# Patient Record
Sex: Female | Born: 1990 | Hispanic: Yes | Marital: Married | Smoking: Never smoker
Health system: Southern US, Community
[De-identification: ages and names within clinical notes are randomized; demographics above are authoritative.]

## PROBLEM LIST (undated history)

## (undated) ENCOUNTER — Inpatient Hospital Stay (HOSPITAL_COMMUNITY): Payer: Self-pay

## (undated) DIAGNOSIS — Z789 Other specified health status: Secondary | ICD-10-CM

## (undated) HISTORY — PX: NO PAST SURGERIES: SHX2092

---

## 2013-10-06 ENCOUNTER — Ambulatory Visit (INDEPENDENT_AMBULATORY_CARE_PROVIDER_SITE_OTHER): Payer: Self-pay | Admitting: Nurse Practitioner

## 2013-10-06 VITALS — BP 98/62 | HR 83 | Temp 98.2°F | Ht 61.0 in | Wt 119.5 lb

## 2013-10-06 DIAGNOSIS — Z309 Encounter for contraceptive management, unspecified: Secondary | ICD-10-CM

## 2013-10-06 MED ORDER — NORGESTIMATE-ETH ESTRADIOL 0.25-35 MG-MCG PO TABS: 1.0000 | ORAL_TABLET | Freq: Every day | ORAL | Status: DC

## 2013-10-06 NOTE — Patient Instructions (Signed)
Oral Contraception Information  Oral contraceptive pills (OCPs) are medicines taken to prevent pregnancy. OCPs work by preventing the ovaries from releasing eggs. The hormones in OCPs also cause the cervical mucus to thicken, preventing the sperm from entering the uterus. The hormones also cause the uterine lining to become thin, not allowing a fertilized egg to attach to the inside of the uterus. OCPs are highly effective when taken exactly as prescribed. However, OCPs do not prevent sexually transmitted diseases (STDs). Safe sex practices, such as using condoms along with the pill, can help prevent STDs.   Before taking the pill, you may have a physical exam and Pap test. Your health care provider may order blood tests. The health care provider will make sure you are a good candidate for oral contraception. Discuss with your health care provider the possible side effects of the OCP you may be prescribed. When starting an OCP, it can take 2 to 3 months for the body to adjust to the changes in hormone levels in your body.   TYPES OF ORAL CONTRACEPTION  · The combination pill This pill contains estrogen and progestin (synthetic progesterone) hormones. The combination pill comes in 21-day, 28-day, or 91-day packs. Some types of combination pills are meant to be taken continuously (365-day pills). With 21-day packs, you do not take pills for 7 days after the last pill. With 28-day packs, the pill is taken every day. The last 7 pills are without hormones. Certain types of pills have more than 21 hormone-containing pills. With 91-day packs, the first 84 pills contain both hormones, and the last 7 pills contain no hormones or contain estrogen only.  · The minipill This pill contains the progesterone hormone only. The pill is taken every day continuously. It is very important to take the pill at the same time each day. The minipill comes in packs of 28 pills. All 28 pills contain the hormone.    ADVANTAGES OF ORAL  CONTRACEPTIVE PILLS  · Decreases premenstrual symptoms.    · Treats menstrual period cramps.    · Regulates the menstrual cycle.    · Decreases a heavy menstrual flow.    · May treat acne, depending on the type of pill.    · Treats abnormal uterine bleeding.    · Treats polycystic ovarian syndrome.    · Treats endometriosis.    · Can be used as emergency contraception.    THINGS THAT CAN MAKE ORAL CONTRACEPTIVE PILLS LESS EFFECTIVE  OCPs can be less effective if:   · You forget to take the pill at the same time every day.    · You have a stomach or intestinal disease that lessens the absorption of the pill.    · You take OCPs with other medicines that make OCPs less effective, such as antibiotics, certain HIV medicines, and some seizure medicines.    · You take expired OCPs.    · You forget to restart the pill on day 7, when using the packs of 21 pills.    RISKS ASSOCIATED WITH ORAL CONTRACEPTIVE PILLS   Oral contraceptive pills can sometimes cause side effects, such as:  · Headache.  · Nausea.  · Breast tenderness.  · Irregular bleeding or spotting.  Combination pills are also associated with a small increased risk of:  · Blood clots.  · Heart attack.  · Stroke.  Document Released: 08/16/2002 Document Revised: 03/16/2013 Document Reviewed: 11/14/2012  [employer]® Patient Information ©2014 [employer].

## 2013-10-06 NOTE — Progress Notes (Signed)
History:  Emily Herman is a 23 y.o. No obstetric history on file. who presents to clinic today to start birth control pills before marriage. She is not and never has been sexually active. She is getting married June 5th and wants to be on them before hand to avoid bleeding on honeymoon and pregnancy. She denies any contraindications to birth control pills.  The following portions of the patient's history were reviewed and updated as appropriate: allergies, current medications, past family history, past medical history, past social history, past surgical history and problem list.  Review of Systems:  Pertinent items are noted in HPI.  Objective:  Physical Exam BP 98/62  Pulse 83  Temp(Src) 98.2 F (36.8 C)  Ht 5\' 1"  (1.549 m)  Wt 119 lb 8 oz (54.205 kg)  BMI 22.59 kg/m2  LMP 09/11/2013 GENERAL: Well-developed, well-nourished female in no acute distress.  HEENT: Normocephalic, atraumatic.  EXTREMITIES: No cyanosis, clubbing, or edema, 2+ distal pulses.   Labs and Imaging No results found. No results found for this or any previous visit (from the past 24 hour(s)).  Assessment & Plan:  Assessment:  Contraception Management  Plans:  OrthoCyclen BCPs advised on use and how to avoid menses RTC 3 months for recheck and can have pelvic exam done then Also [facility]  Emmanuella Mirante M Landa Mullinax, NP 10/06/2013 5:11 PM

## 2013-10-06 NOTE — Progress Notes (Signed)
Pt is not currently sexually active and is getting married 6/5.  She desires to not have her period around this time and is wanting to start birth control pills.

## 2014-03-02 ENCOUNTER — Ambulatory Visit: Payer: Self-pay | Attending: Family Medicine

## 2014-04-13 LAB — OB RESULTS CONSOLE GC/CHLAMYDIA
Chlamydia: NEGATIVE
Gonorrhea: NEGATIVE

## 2014-04-13 LAB — OB RESULTS CONSOLE ABO/RH: RH Type: POSITIVE

## 2014-04-13 LAB — OB RESULTS CONSOLE HIV ANTIBODY (ROUTINE TESTING): HIV: NONREACTIVE

## 2014-04-13 LAB — OB RESULTS CONSOLE HEPATITIS B SURFACE ANTIGEN: Hepatitis B Surface Ag: NEGATIVE

## 2014-04-13 LAB — OB RESULTS CONSOLE RPR: RPR: NONREACTIVE

## 2014-04-13 LAB — OB RESULTS CONSOLE RUBELLA ANTIBODY, IGM: Rubella: IMMUNE

## 2014-04-13 LAB — PROCEDURE REPORT - SCANNED: PAP SMEAR: NEGATIVE

## 2014-04-13 LAB — OB RESULTS CONSOLE ANTIBODY SCREEN: Antibody Screen: NEGATIVE

## 2014-05-19 ENCOUNTER — Other Ambulatory Visit (HOSPITAL_COMMUNITY): Payer: Self-pay | Admitting: Obstetrics

## 2014-05-19 DIAGNOSIS — O4403 Placenta previa specified as without hemorrhage, third trimester: Secondary | ICD-10-CM

## 2014-05-19 DIAGNOSIS — Z3689 Encounter for other specified antenatal screening: Secondary | ICD-10-CM

## 2014-05-19 DIAGNOSIS — Z3A30 30 weeks gestation of pregnancy: Secondary | ICD-10-CM

## 2014-06-09 NOTE — L&D Delivery Note (Signed)
Delivery Note At 2:21 PM a viable female was delivered via Vaginal, Spontaneous Delivery (Presentation: ;  ).  APGAR: , ; weight  .   Placenta status: , .  Cord:  with the following complications: .  Cord pH: not done  Anesthesia: None  Episiotomy:   Lacerations:   Suture Repair: 2.0 vicryl Est. Blood Loss (mL):    Mom to postpartum.  Baby to Couplet care / Skin to Skin.  Jylian Pappalardo A 09/25/2014, 2:33 PM

## 2014-07-07 ENCOUNTER — Other Ambulatory Visit (HOSPITAL_COMMUNITY): Payer: Self-pay

## 2014-07-07 ENCOUNTER — Other Ambulatory Visit (HOSPITAL_COMMUNITY): Payer: Self-pay | Admitting: Obstetrics

## 2014-07-07 ENCOUNTER — Encounter (HOSPITAL_COMMUNITY): Payer: Self-pay

## 2014-07-07 ENCOUNTER — Ambulatory Visit (HOSPITAL_COMMUNITY)
Admission: RE | Admit: 2014-07-07 | Discharge: 2014-07-07 | Disposition: A | Discharge status: Home / Self Care | Payer: Self-pay | Source: Ambulatory Visit | Attending: Obstetrics | Admitting: Obstetrics

## 2014-07-07 DIAGNOSIS — O4403 Placenta previa specified as without hemorrhage, third trimester: Secondary | ICD-10-CM | POA: Insufficient documentation

## 2014-07-07 DIAGNOSIS — O4413 Placenta previa with hemorrhage, third trimester: Secondary | ICD-10-CM | POA: Insufficient documentation

## 2014-07-07 DIAGNOSIS — Z3A26 26 weeks gestation of pregnancy: Secondary | ICD-10-CM | POA: Insufficient documentation

## 2014-07-07 DIAGNOSIS — Z3A30 30 weeks gestation of pregnancy: Secondary | ICD-10-CM

## 2014-07-07 DIAGNOSIS — Z36 Encounter for antenatal screening of mother: Secondary | ICD-10-CM | POA: Insufficient documentation

## 2014-07-07 DIAGNOSIS — Z3689 Encounter for other specified antenatal screening: Secondary | ICD-10-CM

## 2014-07-07 DIAGNOSIS — Z3A29 29 weeks gestation of pregnancy: Secondary | ICD-10-CM | POA: Insufficient documentation

## 2014-07-07 HISTORY — DX: Other specified health status: Z78.9

## 2014-08-04 ENCOUNTER — Ambulatory Visit (HOSPITAL_COMMUNITY): Payer: Self-pay

## 2014-08-17 LAB — OB RESULTS CONSOLE GBS: STREP GROUP B AG: NEGATIVE

## 2014-08-29 ENCOUNTER — Ambulatory Visit (HOSPITAL_COMMUNITY): Payer: Self-pay

## 2014-09-24 ENCOUNTER — Encounter (HOSPITAL_COMMUNITY): Payer: Self-pay | Admitting: *Deleted

## 2014-09-24 ENCOUNTER — Inpatient Hospital Stay (HOSPITAL_COMMUNITY)
Admission: AD | Admit: 2014-09-24 | Discharge: 2014-09-24 | Disposition: A | Discharge status: Home / Self Care | Payer: Self-pay | Source: Ambulatory Visit | Attending: Obstetrics | Admitting: Obstetrics

## 2014-09-24 DIAGNOSIS — O4693 Antepartum hemorrhage, unspecified, third trimester: Secondary | ICD-10-CM

## 2014-09-24 DIAGNOSIS — O26853 Spotting complicating pregnancy, third trimester: Secondary | ICD-10-CM | POA: Insufficient documentation

## 2014-09-24 DIAGNOSIS — Z3A40 40 weeks gestation of pregnancy: Secondary | ICD-10-CM | POA: Insufficient documentation

## 2014-09-24 NOTE — MAU Note (Signed)
Pt woke up to go the bathroom and saw some blood in the toilet.  Has not seen any blood since then.  States having some mild cramping.  Denies LOF.  + Fetal movement.

## 2014-09-24 NOTE — Discharge Instructions (Signed)
Evaluacin de los movimientos fetales  (Fetal Movement Counts) Nombre del paciente: __________________________________________________ Emily ChapmanFecha de parto estimada: ____________________ Emily HammanLa evaluacin de los movimientos fetales es muy recomendable en los embarazos de alto riesgo, pero tambin es una buena idea que lo hagan todas las Dominoembarazadas. El Firefightermdico le indicar que comience a contarlos a las 28 semanas de Metuchenembarazo. Los movimientos fetales suelen aumentar:   Despus de Animatoruna comida completa.  Despus de la actividad fsica.  Despus de comer o beber Emily Herman.  En reposo. Preste atencin cuando sienta que el beb est ms activo. Esto le ayudar a notar un patrn de ciclos de vigilia y sueo de su beb y cules son los factores que contribuyen a un aumento de los movimientos fetales. Es importante llevar a cabo un recuento de movimientos fetales, al mismo tiempo cada da, cuando el beb normalmente est ms activo.  CMO CONTAR LOS MOVIMIENTOS FETALES  Busque un lugar tranquilo y cmodo para sentarse o recostarse sobre el lado izquierdo. Al recostarse sobre su lado izquierdo, le proporciona una mejor circulacin de Emily Herman y oxgeno al beb.  Anote el da y la hora en una hoja de papel o en un diario.  Comience contando las pataditas, revoloteos, chasquidos, vueltas o pinchazos en un perodo de 2 horas. Debe sentir al menos 10 movimientos en 2 horas.  Si no siente 10 movimientos en 2 horas, espere 2  3 horas y cuente de nuevo. Busque cambios en el patrn o si no cuenta lo suficiente en 2 horas. SOLICITE ATENCIN MDICA SI:   Siente menos de 10 pataditas en 2 horas, en dos intentos.  No hay movimientos durante una hora.  El patrn se modifica o le lleva ms tiempo Art gallery managercada da contar las 10 pataditas.  Siente que el beb no se mueve como lo hace habitualmente. Fecha: ____________ Movimientos: ____________ Emily BornHora de inicio: ____________ Emily BornHora de finalizacin: ____________  Emily NonesFecha:  ____________ Movimientos: ____________ Emily BornHora de inicio: ____________ Emily BornHora de finalizacin: ____________  Emily NonesFecha: ____________ Movimientos: ____________ Emily BornHora de inicio: ____________ Emily BornHora de finalizacin: ____________  Emily NonesFecha: ____________ Movimientos: ____________ Emily BornHora de inicio: ____________ Emily BornHora de finalizacin: ____________  Emily NonesFecha: ____________ Movimientos: ____________ Emily BornHora de inicio: ____________ Emily RussianHora de finalizacin: ____________  Emily NonesFecha: ____________ Movimientos: ____________ Emily RussianHora de inicio: ____________ Emily RussianHora de finalizacin: ____________  Emily NonesFecha: ____________ Movimientos: ____________ Emily RussianHora de inicio: ____________ Emily RussianHora de finalizacin: ____________  Emily NonesFecha: ____________ Movimientos: ____________ Emily RussianHora de inicio: ____________ Emily RussianHora de finalizacin: ____________  Emily NonesFecha: ____________ Movimientos: ____________ Emily RussianHora de inicio: ____________ Emily RussianHora de finalizacin: ____________  Emily NonesFecha: ____________ Movimientos: ____________ Emily RussianHora de inicio: ____________ Emily RussianHora de finalizacin: ____________  Emily NonesFecha: ____________ Movimientos: ____________ Emily RussianHora de inicio: ____________ Emily RussianHora de finalizacin: ____________  Emily NonesFecha: ____________ Movimientos: ____________ Emily RussianHora de inicio: ____________ Emily RussianHora de finalizacin: ____________  Emily NonesFecha: ____________ Movimientos: ____________ Emily RussianHora de inicio: ____________ Emily RussianHora de finalizacin: ____________  Emily NonesFecha: ____________ Movimientos: ____________ Emily RussianHora de inicio: ____________ Emily RussianHora de finalizacin: ____________  Emily NonesFecha: ____________ Movimientos: ____________ Emily RussianHora de inicio: ____________ Emily RussianHora de finalizacin: ____________  Emily NonesFecha: ____________ Movimientos: ____________ Emily RussianHora de inicio: ____________ Emily RussianHora de finalizacin: ____________  Emily NonesFecha: ____________ Movimientos: ____________ Emily RussianHora de inicio: ____________ Emily RussianHora de finalizacin: ____________  Emily NonesFecha: ____________ Movimientos: ____________ Emily RussianHora de inicio: ____________ Emily RussianHora de finalizacin: ____________  Emily NonesFecha: ____________ Movimientos: ____________ Emily RussianHora  de inicio: ____________ Emily RussianHora de finalizacin: ____________  Emily NonesFecha: ____________ Movimientos: ____________ Emily RussianHora de inicio: ____________ Emily BornHora de finalizacin: ____________  Emily NonesFecha: ____________ Movimientos: ____________ Emily BornHora de inicio: ____________ Emily BornHora de finalizacin: ____________  Emily NonesFecha: ____________ Movimientos: ____________ Emily BornHora de inicio: ____________ Emily BornHora de  finalizacin: ____________  Emily Herman: ____________ Movimientos: ____________ Emily Herman inicio: ____________ Emily Herman finalizacin: ____________  Emily Herman: ____________ Movimientos: ____________ Emily Herman inicio: ____________ Emily Herman finalizacin: ____________  Emily Herman: ____________ Movimientos: ____________ Emily Herman inicio: ____________ Emily Herman finalizacin: ____________  Emily Herman: ____________ Movimientos: ____________ Emily Herman inicio: ____________ Emily Herman finalizacin: ____________  Emily Herman: ____________ Movimientos: ____________ Emily Herman inicio: ____________ Emily Herman finalizacin: ____________  Emily Herman: ____________ Movimientos: ____________ Emily Herman inicio: ____________ Emily Herman de finalizacin: ____________  Emily Herman: ____________ Movimientos: ____________ Emily Herman de inicio: ____________ Emily Herman de finalizacin: ____________  Emily Herman: ____________ Movimientos: ____________ Emily Herman de inicio: ____________ Emily Herman de finalizacin: ____________  Emily Herman: ____________ Movimientos: ____________ Emily Herman de inicio: ____________ Emily Herman de finalizacin: ____________  Emily Herman: ____________ Movimientos: ____________ Emily Herman de inicio: ____________ Emily Herman de finalizacin: ____________  Emily Herman: ____________ Movimientos: ____________ Emily Herman de inicio: ____________ Emily Herman de finalizacin: ____________  Emily Herman: ____________ Movimientos: ____________ Emily Herman de inicio: ____________ Emily Herman de finalizacin: ____________  Emily Herman: ____________ Movimientos: ____________ Emily Herman de inicio: ____________ Emily Herman de finalizacin: ____________  Emily Herman: ____________ Movimientos: ____________ Emily Herman de inicio: ____________ Emily Herman de finalizacin:  ____________  Emily Herman: ____________ Movimientos: ____________ Emily Herman de inicio: ____________ Emily Herman de finalizacin: ____________  Emily Herman: ____________ Movimientos: ____________ Emily Herman de inicio: ____________ Emily Herman de finalizacin: ____________  Emily Herman: ____________ Movimientos: ____________ Emily Herman de inicio: ____________ Emily Herman de finalizacin: ____________  Emily Herman: ____________ Movimientos: ____________ Emily Herman de inicio: ____________ Emily Herman de finalizacin: ____________  Emily Herman: ____________ Movimientos: ____________ Emily Herman de inicio: ____________ Emily Herman de finalizacin: ____________  Emily Herman: ____________ Movimientos: ____________ Emily Herman de inicio: ____________ Emily Herman de finalizacin: ____________  Emily Herman: ____________ Movimientos: ____________ Emily Herman de inicio: ____________ Emily Herman de finalizacin: ____________  Emily Herman: ____________ Movimientos: ____________ Emily Herman de inicio: ____________ Emily Herman de finalizacin: ____________  Emily Herman: ____________ Movimientos: ____________ Emily Herman de inicio: ____________ Emily Herman de finalizacin: ____________  Emily Herman: ____________ Movimientos: ____________ Emily Herman de inicio: ____________ Emily Herman de finalizacin: ____________  Emily Herman: ____________ Movimientos: ____________ Emily Herman de inicio: ____________ Emily Herman de finalizacin: ____________  Emily Herman: ____________ Movimientos: ____________ Emily Herman de inicio: ____________ Emily Herman de finalizacin: ____________  Emily Herman: ____________ Movimientos: ____________ Emily Herman de inicio: ____________ Emily Herman de finalizacin: ____________  Emily Herman: ____________ Movimientos: ____________ Emily Herman de inicio: ____________ Emily Herman de finalizacin: ____________  Emily Herman: ____________ Movimientos: ____________ Emily Herman de inicio: ____________ Emily Herman de finalizacin: ____________  Emily Herman: ____________ Movimientos: ____________ Emily Herman de inicio: ____________ Emily Herman de finalizacin: ____________  Emily Herman: ____________ Movimientos: ____________ Emily Herman de inicio: ____________ Emily Herman de finalizacin: ____________  Emily Herman: ____________  Movimientos: ____________ Emily Herman de inicio: ____________ Emily Herman de finalizacin: ____________  Emily Herman: ____________ Movimientos: ____________ Emily Herman de inicio: ____________ Emily Herman de finalizacin: ____________  Emily Herman: ____________ Movimientos: ____________ Emily Herman de inicio: ____________ Emily Herman de finalizacin: ____________  Document Released: 09/02/2007 Document Revised: 05/12/2012 ExitCare Patient Information 2015 [place], [place]. This information is not intended to replace advice given to you by your health care provider. Make sure you discuss any questions you have with your health care provider. Fetal Movement Counts Patient Name: __________________________________________________ Patient Due Date: ____________________ Performing a fetal movement count is highly recommended in high-risk pregnancies, but it is good for every pregnant woman to do. Your health care provider may ask you to start counting fetal movements at 28 weeks of the pregnancy. Fetal movements often increase:  After eating a full meal.  After physical activity.  After eating or drinking something sweet or cold.  At rest. Pay attention to when you feel the baby is most active. This will help you notice a pattern of your baby's sleep and wake cycles and what factors contribute to an increase in fetal movement. It is  important to perform a fetal movement count at the same time each day when your baby is normally most active.  HOW TO COUNT FETAL MOVEMENTS  Find a quiet and comfortable area to sit or lie down on your left side. Lying on your left side provides the best blood and oxygen circulation to your baby.  Write down the day and time on a sheet of paper or in a journal.  Start counting kicks, flutters, swishes, rolls, or jabs in a 2-hour period. You should feel at least 10 movements within 2 hours.  If you do not feel 10 movements in 2 hours, wait 2-3 hours and count again. Look for a change in the pattern or not enough counts in 2  hours. SEEK MEDICAL CARE IF:  You feel less than 10 counts in 2 hours, tried twice.  There is no movement in over an hour.  The pattern is changing or taking longer each day to reach 10 counts in 2 hours.  You feel the baby is not moving as he or she usually does. Date: ____________ Movements: ____________ Start time: ____________ Doreatha Martin time: ____________  Date: ____________ Movements: ____________ Start time: ____________ Doreatha Martin time: ____________ Date: ____________ Movements: ____________ Start time: ____________ Doreatha Martin time: ____________ Date: ____________ Movements: ____________ Start time: ____________ Doreatha Martin time: ____________ Date: ____________ Movements: ____________ Start time: ____________ Doreatha Martin time: ____________ Date: ____________ Movements: ____________ Start time: ____________ Doreatha Martin time: ____________ Date: ____________ Movements: ____________ Start time: ____________ Doreatha Martin time: ____________ Date: ____________ Movements: ____________ Start time: ____________ Doreatha Martin time: ____________  Date: ____________ Movements: ____________ Start time: ____________ Doreatha Martin time: ____________ Date: ____________ Movements: ____________ Start time: ____________ Doreatha Martin time: ____________ Date: ____________ Movements: ____________ Start time: ____________ Doreatha Martin time: ____________ Date: ____________ Movements: ____________ Start time: ____________ Doreatha Martin time: ____________ Date: ____________ Movements: ____________ Start time: ____________ Doreatha Martin time: ____________ Date: ____________ Movements: ____________ Start time: ____________ Doreatha Martin time: ____________ Date: ____________ Movements: ____________ Start time: ____________ Doreatha Martin time: ____________  Date: ____________ Movements: ____________ Start time: ____________ Doreatha Martin time: ____________ Date: ____________ Movements: ____________ Start time: ____________ Doreatha Martin time: ____________ Date: ____________ Movements: ____________ Start time:  ____________ Doreatha Martin time: ____________ Date: ____________ Movements: ____________ Start time: ____________ Doreatha Martin time: ____________ Date: ____________ Movements: ____________ Start time: ____________ Doreatha Martin time: ____________ Date: ____________ Movements: ____________ Start time: ____________ Doreatha Martin time: ____________ Date: ____________ Movements: ____________ Start time: ____________ Doreatha Martin time: ____________  Date: ____________ Movements: ____________ Start time: ____________ Doreatha Martin time: ____________ Date: ____________ Movements: ____________ Start time: ____________ Doreatha Martin time: ____________ Date: ____________ Movements: ____________ Start time: ____________ Doreatha Martin time: ____________ Date: ____________ Movements: ____________ Start time: ____________ Doreatha Martin time: ____________ Date: ____________ Movements: ____________ Start time: ____________ Doreatha Martin time: ____________ Date: ____________ Movements: ____________ Start time: ____________ Doreatha Martin time: ____________ Date: ____________ Movements: ____________ Start time: ____________ Doreatha Martin time: ____________  Date: ____________ Movements: ____________ Start time: ____________ Doreatha Martin time: ____________ Date: ____________ Movements: ____________ Start time: ____________ Doreatha Martin time: ____________ Date: ____________ Movements: ____________ Start time: ____________ Doreatha Martin time: ____________ Date: ____________ Movements: ____________ Start time: ____________ Doreatha Martin time: ____________ Date: ____________ Movements: ____________ Start time: ____________ Doreatha Martin time: ____________ Date: ____________ Movements: ____________ Start time: ____________ Doreatha Martin time: ____________ Date: ____________ Movements: ____________ Start time: ____________ Doreatha Martin time: ____________  Date: ____________ Movements: ____________ Start time: ____________ Doreatha Martin time: ____________ Date: ____________ Movements: ____________ Start time: ____________ Doreatha Martin time: ____________ Date:  ____________ Movements: ____________ Start time: ____________ Doreatha Martin time: ____________ Date: ____________ Movements: ____________ Start time: ____________ Doreatha Martin time: ____________ Date: ____________ Movements: ____________ Start time: ____________ Doreatha Martin  time: ____________ Date: ____________ Movements: ____________ Start time: ____________ Doreatha Martin time: ____________ Date: ____________ Movements: ____________ Start time: ____________ Doreatha Martin time: ____________  Date: ____________ Movements: ____________ Start time: ____________ Doreatha Martin time: ____________ Date: ____________ Movements: ____________ Start time: ____________ Doreatha Martin time: ____________ Date: ____________ Movements: ____________ Start time: ____________ Doreatha Martin time: ____________ Date: ____________ Movements: ____________ Start time: ____________ Doreatha Martin time: ____________ Date: ____________ Movements: ____________ Start time: ____________ Doreatha Martin time: ____________ Date: ____________ Movements: ____________ Start time: ____________ Doreatha Martin time: ____________ Date: ____________ Movements: ____________ Start time: ____________ Doreatha Martin time: ____________  Date: ____________ Movements: ____________ Start time: ____________ Doreatha Martin time: ____________ Date: ____________ Movements: ____________ Start time: ____________ Doreatha Martin time: ____________ Date: ____________ Movements: ____________ Start time: ____________ Doreatha Martin time: ____________ Date: ____________ Movements: ____________ Start time: ____________ Doreatha Martin time: ____________ Date: ____________ Movements: ____________ Start time: ____________ Doreatha Martin time: ____________ Date: ____________ Movements: ____________ Start time: ____________ Doreatha Martin time: ____________ Document Released: 06/25/2006 Document Revised: 10/10/2013 Document Reviewed: 03/22/2012 ExitCare Patient Information 2015 [place]. This information is not intended to replace advice given to you by your health care provider. Make  sure you discuss any questions you have with your health care provider. Contracciones de Designer, multimedia (Braxton Hicks Contractions) Durante el [place], pueden presentarse contracciones uterinas que no siempre indican que est en [place].  QU SON LAS CONTRACCIONES DE BRAXTON HICKS?  Las [employer] se presentan antes del [place] de [place] se conocen como contracciones de [place] o falso trabajo de [place]. Hacia el final del embarazo (32 a 34semanas), estas contracciones pueden aparecen con ms frecuencia y volverse ms intensas. No corresponden al Aleen Campi de parto verdadero porque estas contracciones no producen el agrandamiento (la dilatacin) y el afinamiento del cuello del tero. Algunas veces, es difcil distinguirlas del trabajo de parto verdadero porque en algunos casos pueden ser [employer] y las personas tienen diferentes niveles de tolerancia al [employer] No debe sentirse avergonzada si concurre al hospital con falso trabajo de [place]. En ocasiones, la nica forma de saber si el trabajo de parto es verdadero es que el mdico determine si hay cambios en el cuello del tero. Si no hay problemas prenatales u otras complicaciones de salud asociadas con el embarazo, no habr inconvenientes si la envan a su casa con falso trabajo de parto y espera que comience el verdadero. CMO DIFERENCIAR EL TRABAJO DE PARTO FALSO DEL VERDADERO Falso trabajo de parto  Las contracciones del falso trabajo de parto duran menos y no son tan intensas como las verdaderas.  Generalmente son irregulares.  A menudo, se sienten en la parte delantera de la parte baja del abdomen y en la ingle,  y pueden desaparecer cuando camina o cambia de posicin mientras est acostada.  Las contracciones se vuelven ms dbiles y su duracin es Adult nurse a medida que el tiempo transcurre.  Por lo general, no se hacen progresivamente ms intensas, regulares y Herbalist entre s como en el caso del [place] de parto  verdadero. Theodis Blaze de parto  Las contracciones del verdadero trabajo de parto duran de 30 a 70segundos, son muy regulares y suelen volverse ms intensas, y [place] su frecuencia.  No desaparecen cuando camina.  La molestia generalmente se siente en la parte superior del tero y se extiende hacia la zona inferior del abdomen y [employer] cintura.  El mdico podr examinarla para determinar si el trabajo de parto es verdadero. El examen mostrar si el cuello del tero se est dilatando y [place]. LO QUE DEBE RECORDAR  Contine  haciendo los ejercicios habituales y siga otras indicaciones que el mdico le d.  Tome todos los medicamentos como le indic el mdico.  Oceanographer a las visitas prenatales regulares.  Coma y beba con moderacin si cree que est en trabajo de parto.  Si las contracciones de [employer] provocan incomodidad:  Cambie de posicin: si est acostada o descansando, camine; si est caminando, descanse.  Sintese y descanse en una baera con agua tibia.  Beba 2 o 3vasos de [place]. La deshidratacin puede provocar contracciones.  Respire lenta y profundamente varias veces por hora. CUNDO DEBO BUSCAR ASISTENCIA MDICA INMEDIATA? Solicite atencin mdica de inmediato si:  Las contracciones se intensifican, se hacen ms regulares y Arboriculturist s.  Tiene una prdida de lquido por la vagina.  Tiene fiebre.  Elimina mucosidad manchada con [place].  Tiene una hemorragia vaginal abundante.  Tiene dolor abdominal permanente.  Tiene un dolor en la zona lumbar que nunca tuvo antes.  Siente que la cabeza del beb empuja hacia abajo y ejerce presin en la zona plvica.  El beb no se mueve Dentist. Document Released: 03/05/2005 Document Revised: 05/31/2013 [facility] Patient Information 2015 [place], [place]. This information is not intended to replace advice given to you by your health care provider. Make sure you discuss any questions you  have with your health care provider. Braxton Hicks Contractions Contractions of the uterus can occur throughout pregnancy. Contractions are not always a sign that you are in labor.  WHAT ARE BRAXTON HICKS CONTRACTIONS?  Contractions that occur before labor are called Braxton Hicks contractions, or false labor. Toward the end of pregnancy (32-34 weeks), these contractions can develop more often and may become more forceful. This is not true labor because these contractions do not result in opening (dilatation) and thinning of the cervix. They are sometimes difficult to tell apart from true labor because these contractions can be forceful and people have different pain tolerances. You should not feel embarrassed if you go to the hospital with false labor. Sometimes, the only way to tell if you are in true labor is for your health care provider to look for changes in the cervix. If there are no prenatal problems or other health problems associated with the pregnancy, it is completely safe to be sent home with false labor and await the onset of true labor. HOW CAN YOU TELL THE DIFFERENCE BETWEEN TRUE AND FALSE LABOR? False Labor  The contractions of false labor are usually shorter and not as hard as those of true labor.   The contractions are usually irregular.   The contractions are often felt in the front of the lower abdomen and in the groin.   The contractions may go away when you walk around or change positions while lying down.   The contractions get weaker and are shorter lasting as time goes on.   The contractions do not usually become progressively stronger, regular, and closer together as with true labor.  True Labor  Contractions in true labor last 30-70 seconds, become very regular, usually become more intense, and increase in frequency.   The contractions do not go away with walking.   The discomfort is usually felt in the top of the uterus and spreads to the lower abdomen  and low back.   True labor can be determined by your health care provider with an exam. This will show that the cervix is dilating and getting thinner.  WHAT TO REMEMBER  Keep up with your  usual exercises and follow other instructions given by your health care provider.   Take medicines as directed by your health care provider.   Keep your regular prenatal appointments.   Eat and drink lightly if you think you are going into labor.   If Braxton Hicks contractions are making you uncomfortable:   Change your position from lying down or resting to walking, or from walking to resting.   Sit and rest in a tub of warm water.   Drink 2-3 glasses of water. Dehydration may cause these contractions.   Do slow and deep breathing several times an hour.  WHEN SHOULD I SEEK IMMEDIATE MEDICAL CARE? Seek immediate medical care if:  Your contractions become stronger, more regular, and closer together.   You have fluid leaking or gushing from your vagina.   You have a fever.   You pass blood-tinged mucus.   You have vaginal bleeding.   You have continuous abdominal pain.   You have low back pain that you never had before.   You feel your baby's head pushing down and causing pelvic pressure.   Your baby is not moving as much as it used to.  Document Released: 05/26/2005 Document Revised: 05/31/2013 Document Reviewed: 03/07/2013 [facility] Patient Information 2015 [place], [place]. This information is not intended to replace advice given to you by your health care provider. Make sure you discuss any questions you have with your health care provider. Contracciones de Designer, multimedia (Braxton Hicks Contractions) Durante el [place], pueden presentarse contracciones uterinas que no siempre indican que est en [place].  QU SON LAS CONTRACCIONES DE BRAXTON HICKS?  Las [employer] se presentan antes del [place] de [place] se conocen como contracciones de [place] o falso trabajo de [place]. Hacia el final del embarazo (32 a 34semanas), estas contracciones pueden aparecen con ms frecuencia y volverse ms intensas. No corresponden al Aleen Campi de parto verdadero porque estas contracciones no producen el agrandamiento (la dilatacin) y el afinamiento del cuello del tero. Algunas veces, es difcil distinguirlas del trabajo de parto verdadero porque en algunos casos pueden ser [employer], y las personas tienen diferentes niveles de tolerancia al [employer]. No debe sentirse avergonzada si concurre al hospital con falso trabajo de [place]. En ocasiones, la nica forma de saber si el trabajo de parto es verdadero es que el mdico determine si hay cambios en el cuello del tero. Si no hay problemas prenatales u otras complicaciones de salud asociadas con el embarazo, no habr inconvenientes si la envan a su casa con falso trabajo de parto y espera que comience el verdadero. CMO DIFERENCIAR EL TRABAJO DE PARTO FALSO DEL VERDADERO Falso trabajo de parto  Las contracciones del falso trabajo de parto duran menos y no son tan intensas como las verdaderas.  Generalmente son irregulares.  A menudo, se sienten en la parte delantera de la parte baja del abdomen y en la ingle,  y pueden desaparecer cuando camina o cambia de posicin mientras est acostada.  Las contracciones se vuelven ms dbiles y su duracin es Adult nurse a medida que el tiempo transcurre.  Por lo general, no se hacen progresivamente ms intensas, regulares y Herbalist entre s como en el caso del [place] de parto verdadero. Theodis Blaze de parto  Las contracciones del verdadero trabajo de parto duran de 30 a 70segundos, son muy regulares y suelen volverse ms intensas, y [place] su frecuencia.  No desaparecen cuando camina.  La molestia generalmente se siente en la parte superior  del tero y se extiende hacia la zona inferior del abdomen y [employer] cintura.  El mdico podr examinarla para  determinar si el trabajo de parto es verdadero. El examen mostrar si el cuello del tero se est dilatando y [place]. LO QUE DEBE RECORDAR  Contine haciendo los ejercicios habituales y siga otras indicaciones que el mdico le d.  Tome todos los medicamentos como le indic el mdico.  Oceanographer a las visitas prenatales regulares.  Coma y beba con moderacin si cree que est en trabajo de parto.  Si las contracciones de [employer] provocan incomodidad:  Cambie de posicin: si est acostada o descansando, camine; si est caminando, descanse.  Sintese y descanse en una baera con agua tibia.  Beba 2 o 3vasos de [place]. La deshidratacin puede provocar contracciones.  Respire lenta y profundamente varias veces por hora. CUNDO DEBO BUSCAR ASISTENCIA MDICA INMEDIATA? Solicite atencin mdica de inmediato si:  Las contracciones se intensifican, se hacen ms regulares y Arboriculturist s.  Tiene una prdida de lquido por la vagina.  Tiene fiebre.  Elimina mucosidad manchada con Star.  Tiene una hemorragia vaginal abundante.  Tiene dolor abdominal permanente.  Tiene un dolor en la zona lumbar que nunca tuvo antes.  Siente que la cabeza del beb empuja hacia abajo y ejerce presin en la zona plvica.  El beb no se mueve Dentist. Document Released: 03/05/2005 Document Revised: 05/31/2013 [facility] Patient Information 2015 [place], [place]. This information is not intended to replace advice given to you by your health care provider. Make sure you discuss any questions you have with your health care provider. Braxton Hicks Contractions Contractions of the uterus can occur throughout pregnancy. Contractions are not always a sign that you are in labor.  WHAT ARE BRAXTON HICKS CONTRACTIONS?  Contractions that occur before labor are called Braxton Hicks contractions, or false labor. Toward the end of pregnancy (32-34 weeks), these contractions can develop more often  and may become more forceful. This is not true labor because these contractions do not result in opening (dilatation) and thinning of the cervix. They are sometimes difficult to tell apart from true labor because these contractions can be forceful and people have different pain tolerances. You should not feel embarrassed if you go to the hospital with false labor. Sometimes, the only way to tell if you are in true labor is for your health care provider to look for changes in the cervix. If there are no prenatal problems or other health problems associated with the pregnancy, it is completely safe to be sent home with false labor and await the onset of true labor. HOW CAN YOU TELL THE DIFFERENCE BETWEEN TRUE AND FALSE LABOR? False Labor  The contractions of false labor are usually shorter and not as hard as those of true labor.   The contractions are usually irregular.   The contractions are often felt in the front of the lower abdomen and in the groin.   The contractions may go away when you walk around or change positions while lying down.   The contractions get weaker and are shorter lasting as time goes on.   The contractions do not usually become progressively stronger, regular, and closer together as with true labor.  True Labor  Contractions in true labor last 30-70 seconds, become very regular, usually become more intense, and increase in frequency.   The contractions do not go away with walking.   The discomfort is usually felt in the  top of the uterus and spreads to the lower abdomen and low back.   True labor can be determined by your health care provider with an exam. This will show that the cervix is dilating and getting thinner.  WHAT TO REMEMBER  Keep up with your usual exercises and follow other instructions given by your health care provider.   Take medicines as directed by your health care provider.   Keep your regular prenatal appointments.   Eat and  drink lightly if you think you are going into labor.   If Braxton Hicks contractions are making you uncomfortable:   Change your position from lying down or resting to walking, or from walking to resting.   Sit and rest in a tub of warm water.   Drink 2-3 glasses of water. Dehydration may cause these contractions.   Do slow and deep breathing several times an hour.  WHEN SHOULD I SEEK IMMEDIATE MEDICAL CARE? Seek immediate medical care if:  Your contractions become stronger, more regular, and closer together.   You have fluid leaking or gushing from your vagina.   You have a fever.   You pass blood-tinged mucus.   You have vaginal bleeding.   You have continuous abdominal pain.   You have low back pain that you never had before.   You feel your baby's head pushing down and causing pelvic pressure.   Your baby is not moving as much as it used to.  Document Released: 05/26/2005 Document Revised: 05/31/2013 Document Reviewed: 03/07/2013 [facility] Patient Information 2015 [place], [place]. This information is not intended to replace advice given to you by your health care provider. Make sure you discuss any questions you have with your health care provider.  Hemorragia vaginal durante el embarazo (tercer trimestre) (Vaginal Bleeding During Pregnancy, Third Trimester)  Durante el embarazo, es comn tener una pequea hemorragia vaginal (manchas). A veces, la hemorragia es normal y no representa un problema, pero en algunas ocasiones es un sntoma de algo grave. Asegrese de decirle a su mdico de inmediato si tiene algn tipo de hemorragia vaginal. CUIDADOS EN EL HOGAR  Controle su afeccin para ver si hay cambios.  Siga las indicaciones de su mdico con respecto al [place]grado de actividad que [place] Mountainpuede tener.  Si debe hacer reposo en cama:  Es posible que deba quedarse en cama y levantarse nicamente para ir al bao.  Quizs le permitan hacer The PNC Financialalgunas actividades.  Si  es necesario, planifique que alguien la ayude.  Marcelino FreestoneEscriba:  La cantidad de toallas higinicas que [place]ausa cada da.  La frecuencia con la que se cambia las toallas higinicas.  Indique que tan empapados (saturados) estn.  No use tampones.  No se haga duchas vaginales.  No tenga relaciones sexuales ni orgasmos hasta que el mdico la autorice.  Siga los consejos de su mdico acerca de levantar objetos pesados, conducir automviles y Education officer, environmentalrealizar actividad fsica.  Si elimina tejido por la vagina, gurdelo para mostrrselo al American Expressmdico.  Tome los medicamentos solamente como se lo haya indicado el mdico.  No tome aspirina, ya que puede causar hemorragias.  Concurra a todas las visitas de control como se lo haya indicado el mdico. SOLICITE AYUDA SI:   Tiene una hemorragia vaginal.  Tiene clicos.  Tiene dolores de [place].  Tiene fiebre que no desaparece despus de Teacher, adult educationtomar medicamentos. SOLICITE AYUDA DE INMEDIATO SI:  Siente clicos muy intensos en la espalda o en el vientre (abdomen).  Tiene escalofros.  Tiene una prdida de lquido por  la vagina.  Elimina cogulos grandes o tejido por la vagina.  Tiene ms hemorragia.  Se siente dbil o que va a desvanecerse.  Pierde el conocimiento (se desmaya).  No siente que el beb se mueva tanto como antes. ASEGRESE DE QUE:  Comprende estas instrucciones.  Controlar su afeccin.  Recibir ayuda de inmediato si no mejora o si empeora. Document Released: 10/10/2013 [facility] Patient Information 2015 [place], [place]. This information is not intended to replace advice given to you by your health care provider. Make sure you discuss any questions you have with your health care provider.

## 2014-09-24 NOTE — MAU Provider Note (Signed)
  History     CSN: [id number]  Arrival date and time: 09/24/14 [id number]   First Provider Initiated Contact with Patient 09/24/14 0354      Chief Complaint  Patient presents with  . Vaginal Bleeding   HPI Emily Herman 23 y.o. G1P0 @[redacted]w[redacted]d  presents to MAU with vaginal spotting.  She awakened at 3am to urinate and noticed a small amt of vaginal bleeding in the toilet.  She states it is more like spotting than bleeding.  No further blood noticed after wiping.  Endorses good fetal movement.  Denies LOF, contractions, abdominal pain, dysuria.   OB History    Gravida Para Term Preterm AB TAB SAB Ectopic Multiple Living   1         0      Past Medical History  Diagnosis Date  . Medical history non-contributory     Past Surgical History  Procedure Laterality Date  . No past surgeries      History reviewed. No pertinent family history.  History  Substance Use Topics  . Smoking status: Never Smoker   . Smokeless tobacco: Not on file  . Alcohol Use: No    Allergies: No Known Allergies  Prescriptions prior to admission  Medication Sig Dispense Refill Last Dose  . Prenatal Multivit-Min-Fe-FA (PRENATAL VITAMINS PO) Take by mouth.   09/23/2014 at Unknown time  . norgestimate-ethinyl estradiol (ORTHO-CYCLEN,SPRINTEC,PREVIFEM) 0.25-35 MG-MCG tablet Take 1 tablet by mouth daily. (Patient not taking: Reported on 07/07/2014) 1 Package 11 Not Taking    ROS Pertinent ROS in HPI  Physical Exam   Blood pressure 111/77, pulse 88, temperature 98.6 F (37 C), temperature source Oral, resp. rate 16, height 5\' 1"  (1.549 m), weight 151 lb 1.3 oz (68.529 kg), last menstrual period 12/14/2013.  Physical Exam  Constitutional: She is oriented to person, place, and time. She appears well-developed and well-nourished. No distress.  HENT:  Head: Normocephalic and atraumatic.  Eyes: EOM are normal.  Neck: Normal range of motion.  Cardiovascular: Normal rate and regular rhythm.    Respiratory: Breath sounds normal. No respiratory distress.  GI: Soft. Bowel sounds are normal. She exhibits no distension. There is no tenderness. There is no rebound and no guarding.  Genitourinary:  Small amt of dark red blood seen in vagina but no bleeding noted at the os.   Cervix is finger tip, thick, posterior  Musculoskeletal: Normal range of motion.  Neurological: She is alert and oriented to person, place, and time.  Skin: Skin is warm and dry.  Psychiatric: She has a normal mood and affect.   Fetal Tracing: Baseline: 130 Variability:mod Accelerations: 15x15 Decelerations: Toco: irritability with occasional contraction noted   MAU Course  Procedures  MDM Tracing is reactive  Assessment and Plan  A:  1. Vaginal bleeding in pregnancy, third trimester   Likely bloody show.    P: Discharge to home RN to contact Dr. Clearance CootsHarper for further directions Keep all f/u scheduled Patient may return to MAU as needed or if her condition were to change or worsen   Bertram Denvereague Clark, [name] 09/24/2014, 3:55 AM

## 2014-09-25 ENCOUNTER — Encounter (HOSPITAL_COMMUNITY): Payer: Self-pay

## 2014-09-25 ENCOUNTER — Inpatient Hospital Stay (HOSPITAL_COMMUNITY)
Admission: RE | Admit: 2014-09-25 | Discharge: 2014-09-27 | DRG: 775 | Disposition: A | Discharge status: Home / Self Care | Payer: Medicaid Other | Source: Ambulatory Visit | Attending: Obstetrics | Admitting: Obstetrics

## 2014-09-25 DIAGNOSIS — Z3A40 40 weeks gestation of pregnancy: Secondary | ICD-10-CM | POA: Diagnosis present

## 2014-09-25 DIAGNOSIS — O0990 Supervision of high risk pregnancy, unspecified, unspecified trimester: Secondary | ICD-10-CM

## 2014-09-25 DIAGNOSIS — Z3490 Encounter for supervision of normal pregnancy, unspecified, unspecified trimester: Secondary | ICD-10-CM

## 2014-09-25 LAB — CBC
HEMATOCRIT: 38.2 % (ref 36.0–46.0)
Hemoglobin: 12.8 g/dL (ref 12.0–15.0)
MCH: 27.5 pg (ref 26.0–34.0)
MCHC: 33.5 g/dL (ref 30.0–36.0)
MCV: 82 fL (ref 78.0–100.0)
PLATELETS: 242 10*3/uL (ref 150–400)
RBC: 4.66 MIL/uL (ref 3.87–5.11)
RDW: 14 % (ref 11.5–15.5)
WBC: 8.6 10*3/uL (ref 4.0–10.5)

## 2014-09-25 LAB — TYPE AND SCREEN
ABO/RH(D): O POS
Antibody Screen: NEGATIVE

## 2014-09-25 LAB — ABO/RH: ABO/RH(D): O POS

## 2014-09-25 MED ORDER — ZOLPIDEM TARTRATE 5 MG PO TABS: 5.0000 mg | ORAL_TABLET | Freq: Every evening | ORAL | Status: DC | PRN

## 2014-09-25 MED ORDER — CITRIC ACID-SODIUM CITRATE 334-500 MG/5ML PO SOLN: 30.0000 mL | ORAL | Status: DC | PRN

## 2014-09-25 MED ORDER — ONDANSETRON HCL 4 MG PO TABS: 4.0000 mg | ORAL_TABLET | ORAL | Status: DC | PRN

## 2014-09-25 MED ORDER — LANOLIN HYDROUS EX OINT: TOPICAL_OINTMENT | CUTANEOUS | Status: DC | PRN

## 2014-09-25 MED ORDER — ONDANSETRON HCL 4 MG/2ML IJ SOLN: 4.0000 mg | Freq: Four times a day (QID) | INTRAMUSCULAR | Status: DC | PRN

## 2014-09-25 MED ORDER — LACTATED RINGERS IV SOLN
INTRAVENOUS | Status: DC
  Administered 2014-09-25: 14:00:00 via INTRAVENOUS

## 2014-09-25 MED ORDER — LIDOCAINE HCL (PF) 1 % IJ SOLN
30.0000 mL | INTRAMUSCULAR | Status: DC | PRN
  Administered 2014-09-25: 30 mL via SUBCUTANEOUS
  Filled 2014-09-25: qty 30

## 2014-09-25 MED ORDER — DIBUCAINE 1 % RE OINT: 1.0000 "application " | TOPICAL_OINTMENT | RECTAL | Status: DC | PRN

## 2014-09-25 MED ORDER — ONDANSETRON HCL 4 MG/2ML IJ SOLN: 4.0000 mg | INTRAMUSCULAR | Status: DC | PRN

## 2014-09-25 MED ORDER — SIMETHICONE 80 MG PO CHEW
80.0000 mg | CHEWABLE_TABLET | ORAL | Status: DC | PRN
  Filled 2014-09-25: qty 1

## 2014-09-25 MED ORDER — BENZOCAINE-MENTHOL 20-0.5 % EX AERO
1.0000 "application " | INHALATION_SPRAY | CUTANEOUS | Status: DC | PRN
  Administered 2014-09-26: 1 via TOPICAL
  Filled 2014-09-25: qty 56

## 2014-09-25 MED ORDER — OXYCODONE-ACETAMINOPHEN 5-325 MG PO TABS
1.0000 | ORAL_TABLET | ORAL | Status: DC | PRN
  Filled 2014-09-25: qty 1

## 2014-09-25 MED ORDER — LACTATED RINGERS IV SOLN
500.0000 mL | INTRAVENOUS | Status: DC | PRN
  Administered 2014-09-25: 300 mL via INTRAVENOUS

## 2014-09-25 MED ORDER — DIPHENHYDRAMINE HCL 25 MG PO CAPS: 25.0000 mg | ORAL_CAPSULE | Freq: Four times a day (QID) | ORAL | Status: DC | PRN

## 2014-09-25 MED ORDER — SENNOSIDES-DOCUSATE SODIUM 8.6-50 MG PO TABS
2.0000 | ORAL_TABLET | ORAL | Status: DC
  Administered 2014-09-26 (×2): 2 via ORAL
  Filled 2014-09-25 (×2): qty 2

## 2014-09-25 MED ORDER — OXYTOCIN BOLUS FROM INFUSION: 500.0000 mL | INTRAVENOUS | Status: DC

## 2014-09-25 MED ORDER — FERROUS SULFATE 325 (65 FE) MG PO TABS
325.0000 mg | ORAL_TABLET | Freq: Two times a day (BID) | ORAL | Status: DC
  Administered 2014-09-26 – 2014-09-27 (×3): 325 mg via ORAL
  Filled 2014-09-25 (×3): qty 1

## 2014-09-25 MED ORDER — BUTORPHANOL TARTRATE 1 MG/ML IJ SOLN
1.0000 mg | INTRAMUSCULAR | Status: DC | PRN
  Administered 2014-09-25: 1 mg via INTRAVENOUS
  Filled 2014-09-25: qty 1

## 2014-09-25 MED ORDER — TERBUTALINE SULFATE 1 MG/ML IJ SOLN: 0.2500 mg | Freq: Once | INTRAMUSCULAR | Status: DC | PRN

## 2014-09-25 MED ORDER — OXYCODONE-ACETAMINOPHEN 5-325 MG PO TABS
1.0000 | ORAL_TABLET | ORAL | Status: DC | PRN
  Administered 2014-09-25 (×2): 1 via ORAL

## 2014-09-25 MED ORDER — OXYTOCIN 40 UNITS IN LACTATED RINGERS INFUSION - SIMPLE MED
1.0000 m[IU]/min | INTRAVENOUS | Status: DC
  Administered 2014-09-25: 2 m[IU]/min via INTRAVENOUS
  Filled 2014-09-25: qty 1000

## 2014-09-25 MED ORDER — IBUPROFEN 600 MG PO TABS
600.0000 mg | ORAL_TABLET | Freq: Four times a day (QID) | ORAL | Status: DC
  Administered 2014-09-25 – 2014-09-27 (×8): 600 mg via ORAL
  Filled 2014-09-25 (×8): qty 1

## 2014-09-25 MED ORDER — WITCH HAZEL-GLYCERIN EX PADS: 1.0000 "application " | MEDICATED_PAD | CUTANEOUS | Status: DC | PRN

## 2014-09-25 MED ORDER — PRENATAL MULTIVITAMIN CH
1.0000 | ORAL_TABLET | Freq: Every day | ORAL | Status: DC
  Administered 2014-09-26 – 2014-09-27 (×2): 1 via ORAL
  Filled 2014-09-25 (×3): qty 1

## 2014-09-25 MED ORDER — ACETAMINOPHEN 325 MG PO TABS: 650.0000 mg | ORAL_TABLET | ORAL | Status: DC | PRN

## 2014-09-25 MED ORDER — OXYTOCIN 40 UNITS IN LACTATED RINGERS INFUSION - SIMPLE MED: 62.5000 mL/h | INTRAVENOUS | Status: DC

## 2014-09-25 MED ORDER — OXYCODONE-ACETAMINOPHEN 5-325 MG PO TABS: 2.0000 | ORAL_TABLET | ORAL | Status: DC | PRN

## 2014-09-25 MED ORDER — OXYCODONE-ACETAMINOPHEN 5-325 MG PO TABS
2.0000 | ORAL_TABLET | ORAL | Status: DC | PRN
  Filled 2014-09-25: qty 2

## 2014-09-25 MED ORDER — ACETAMINOPHEN 325 MG PO TABS
650.0000 mg | ORAL_TABLET | ORAL | Status: DC | PRN
Start: 2014-09-25 — End: 2014-09-27

## 2014-09-25 MED ORDER — FLEET ENEMA 7-19 GM/118ML RE ENEM: 1.0000 | ENEMA | RECTAL | Status: DC | PRN

## 2014-09-25 MED ORDER — TETANUS-DIPHTH-ACELL PERTUSSIS 5-2.5-18.5 LF-MCG/0.5 IM SUSP
0.5000 mL | Freq: Once | INTRAMUSCULAR | Status: AC
  Administered 2014-09-27: 0.5 mL via INTRAMUSCULAR

## 2014-09-25 NOTE — Lactation Note (Signed)
This note was copied from the chart of Emily Herman. Lactation Consultation Note  P1.  Clydie BraunKaren RN worked with mother on hand expression and gave a few drops to baby. Baby has been sleepy.  Mother has attempted.  Baby is sleeping STS on mother's chest. Discussed feeding cues, cluster feeding and encouraged mother to hand express before latching. Encouraged mother to call for assistance w/ next feeding. Mom encouraged to feed baby 8-12 times/24 hours and with feeding cues.  Mom made aware of O/P services, breastfeeding support groups, community resources, and our phone # for post-discharge questions.    Patient Name: Emily [place] Herman Today's Date: 09/25/2014 Reason for consult: Initial assessment   Maternal Data Has patient been taught Hand Expression?: Yes Does the patient have breastfeeding experience prior to this delivery?: No  Feeding Feeding Type: Breast Fed Length of feed: 5 min (per mom)  LATCH Score/Interventions                      Lactation Tools Discussed/Used     Consult Status Consult Status: Follow-up Date: 09/26/14 Follow-up type: In-patient    Dahlia ByesBerkelhammer, Rudine Rieger [facility] 09/25/2014, 9:27 PM

## 2014-09-25 NOTE — H&P (Signed)
This is Dr. Francoise CeoBernard Soraiya Ahner dictating the history and physical on  Emily Herman  she's a 24 year old gravida 1 at 40 weeks and 5 days EDC 09/20/2014 negative GBS and she was admitted for induction of labor cervix was 2 cm 80% vertex -2 amniotomy performed no fluid obtained at that time and she is on low-dose Pitocin Past medical history negative Past surgical history negative Social history negative System review noncontributory Physical exam well-developed female in labor HEENT negative Lungs clear to P&A Heart regular rhythm no murmurs no gallops Breasts negative Abdomen term Pelvic as described above Extremities negative

## 2014-09-26 LAB — CBC
HCT: 31.9 % — ABNORMAL LOW (ref 36.0–46.0)
HEMOGLOBIN: 10.4 g/dL — AB (ref 12.0–15.0)
MCH: 26.9 pg (ref 26.0–34.0)
MCHC: 32.6 g/dL (ref 30.0–36.0)
MCV: 82.4 fL (ref 78.0–100.0)
PLATELETS: 229 10*3/uL (ref 150–400)
RBC: 3.87 MIL/uL (ref 3.87–5.11)
RDW: 14 % (ref 11.5–15.5)
WBC: 12.7 10*3/uL — ABNORMAL HIGH (ref 4.0–10.5)

## 2014-09-26 LAB — RPR: RPR Ser Ql: NONREACTIVE

## 2014-09-26 NOTE — Lactation Note (Signed)
This note was copied from the chart of Emily Herman. Lactation Consultation Note Went to room for consult. Had a room full of visitors holding baby. Asked to come back later. Patient Name: Emily Herman Today's Date: 09/26/2014     Maternal Data    Feeding    LATCH Score/Interventions                      Lactation Tools Discussed/Used     Consult Status      Kylieann Eagles, Diamond NickelLAURA G 09/26/2014, 1:20 PM

## 2014-09-26 NOTE — H&P (Signed)
This is Dr. Francoise CeoBernard Arnetra Terris dictating the history and physical on  Emily Herman she is a 24 year old gravida 1 at 40 weeks and 5 days negative GBS was brought in for induction her cervix was 2 cm 90% vertex -2 amniotomy performed and the fluid was clear patient progressed satisfactorily and had a normal vaginal delivery Past medical history negative Past surgical history negative Social history noncontributory System review negative Physical exam well-developed female in early labor HEENT negative Lungs clear to P&A Heart regular rhythm no murmurs no gallops Breasts negative Abdomen term Pelvic as described above Extremities negative

## 2014-09-26 NOTE — Lactation Note (Signed)
This note was copied from the chart of Emily [place] Herman. Lactation Consultation Note Mom c/o sore nipples and painful latch. Mom has cone shaped breast. Bouncy areolas. Nipple evert w/stimulation. Nipple tissue slightly thick w/compression and baby doesn't get a deep latch. Fitted mom w/#20 NS, taught application w/"C" hold. BF in football hold using pillows for positioning. Mom stated no more pain. Instructed w/chin tug.  Hand expression taught w/good easy colostrum flow. Breast slightly heavy. Hand expressed 3ml colostrum, gave in curve tip syring in NS to get suckling when first latched. Breast massage encouraged at intervals during BF. Noted swallows. Mom has a DEBP in rm. And was discouraged d/t no milk noted when pumped. Explained thickness of colostrum, hand expression better during colostrum phase, DEBP is to stimulate milk production.  Gave shells to wear between feedings to evert nipples for deeper latch.  Mom had comfort gels, encouraged to wear after feedings no longer than 1 hour.  Encouraged mom to look for milk transfer in NS. Referred to Baby and Me Book in Breastfeeding section Pg. 22-23 for position options and Proper latch demonstration.Mom encouraged to do skin-to-skin.Mom encouraged to waken baby for feeds. Educated about newborn behavior.    Patient Name: Emily Herman Today's Date: 09/26/2014 Reason for consult: Initial assessment   Maternal Data    Feeding Feeding Type: Breast Milk Length of feed: 20 min  LATCH Score/Interventions Latch: Grasps breast easily, tongue down, lips flanged, rhythmical sucking. Intervention(s): Skin to skin;Teach feeding cues;Waking techniques  Audible Swallowing: A few with stimulation Intervention(s): Skin to skin;Hand expression Intervention(s): Alternate breast massage  Type of Nipple: Everted at rest and after stimulation (sort shaft thick)  Comfort (Breast/Nipple): Filling, red/small blisters or  bruises, mild/mod discomfort  Problem noted: Mild/Moderate discomfort Interventions (Mild/moderate discomfort): Comfort gels;Breast shields;Hand massage;Hand expression  Hold (Positioning): Assistance needed to correctly position infant at breast and maintain latch. Intervention(s): Breastfeeding basics reviewed;Support Pillows;Position options;Skin to skin  LATCH Score: 7  Lactation Tools Discussed/Used Tools: Shells;Nipple Shields;Pump;Comfort gels Nipple shield size: 20;24 Shell Type: Inverted Breast pump type: Double-Electric Breast Pump Pump Review: Setup, frequency, and cleaning;Milk Storage Initiated by:: RN Date initiated:: 09/26/14   Consult Status Consult Status: Follow-up Date: 09/27/14 Follow-up type: In-patient    Charyl DancerCARVER, Kert Shackett G 09/26/2014, 3:19 PM

## 2014-09-26 NOTE — Progress Notes (Signed)
UR chart review completed.  

## 2014-09-26 NOTE — Progress Notes (Signed)
Patient ID: Emily Herman, female   DOB: Apr 21, 1991, 24 y.o.   MRN: [id number] Postpartum day one Blood pressure [phone] respiration 18 pulse 81 Fundus firm Lochia moderate Legs negative Doing well

## 2014-09-27 NOTE — Lactation Note (Signed)
This note was copied from the chart of Emily Herman. Lactation Consultation Note  When I walked into the room baby was wrapped in the crib and crying.  Mom had a bottle of formula in her hand.  I asked her how BF was going and she stated it was going better.  She reports using the nipple shield.  Her nipples are sore and she is applying comfort gels to them.  She denied any need for help and since she is planning to feed it formula at this feeding I told her to continue working on BF and to call for assistance as needed. Patient Name: Emily Herman Today's Date: 09/27/2014     Maternal Data    Feeding Feeding Type: Bottle Fed - Formula Length of feed: 10 min  LATCH Score/Interventions                      Lactation Tools Discussed/Used     Consult Status      Emily Herman, Emily Herman 09/27/2014, 10:31 AM

## 2014-09-27 NOTE — Discharge Summary (Signed)
Obstetric Discharge Summary Reason for Admission: induction of labor Prenatal Procedures: none Intrapartum Procedures: spontaneous vaginal delivery Postpartum Procedures: none Complications-Operative and Postpartum: none HEMOGLOBIN  Date Value Ref Range Status  09/26/2014 10.4* 12.0 - 15.0 g/dL Final    Comment:    DELTA CHECK NOTED REPEATED TO VERIFY    HCT  Date Value Ref Range Status  09/26/2014 31.9* 36.0 - 46.0 % Final    Physical Exam:  General: alert Lochia: appropriate Uterine Fundus: firm Incision: healing well DVT Evaluation: No evidence of DVT seen on physical exam.  Discharge Diagnoses: Term Pregnancy-delivered  Discharge Information: Date: 09/27/2014 Activity: pelvic rest Diet: routine Medications: Percocet Condition: improved Instructions: refer to practice specific booklet Discharge to: home Follow-up Information    Follow up with Kathreen CosierMARSHALL,Tellis Spivak A, MD.   Specialty:  Obstetrics and Gynecology   Contact information:   [address] [place] [id number] [phone]       Newborn Data: Live born female  Birth Weight: 6 lb 13.9 oz (3115 g) APGAR: 9, 9  Home with mother.  Lindsi Bayliss A 09/27/2014, 6:09 AM

## 2014-09-27 NOTE — Progress Notes (Signed)
Patient ID: Emily Herman, female   DOB: [birth year], 24 y.o.   MRN: [id number] Postpartum day 2 Blood pressure 89/52 Respiration 18 Pulse 95 Fundus firm lochia moderate Legs negative home today

## 2014-09-27 NOTE — Discharge Instructions (Signed)
Discharge instructions   You can wash your hair  Shower  Eat what you want  Drink what you want  See me in 6 weeks  Your ankles are going to swell more in the next 2 weeks than when pregnant  No sex for 6 weeks   Emily Herman A, MD 09/27/2014

## 2015-05-26 ENCOUNTER — Encounter (HOSPITAL_COMMUNITY): Payer: Self-pay | Admitting: Emergency Medicine

## 2015-05-26 ENCOUNTER — Emergency Department (INDEPENDENT_AMBULATORY_CARE_PROVIDER_SITE_OTHER)
Admission: EM | Admit: 2015-05-26 | Discharge: 2015-05-26 | Disposition: A | Discharge status: Home / Self Care | Payer: Self-pay | Source: Home / Self Care | Attending: Emergency Medicine | Admitting: Emergency Medicine

## 2015-05-26 DIAGNOSIS — J069 Acute upper respiratory infection, unspecified: Secondary | ICD-10-CM

## 2015-05-26 MED ORDER — IPRATROPIUM BROMIDE 0.06 % NA SOLN: 2.0000 | Freq: Four times a day (QID) | NASAL | Status: DC

## 2015-05-26 NOTE — ED Notes (Signed)
C/o cold sx onset 6 days Sx include prod cough, vomiting, congestion, runny nose, wheezing Denies fevers A&O x4... No acute distress.

## 2015-05-26 NOTE — ED Provider Notes (Signed)
CSN: [id number]     Arrival date & time 05/26/15  1307 History   First MD Initiated Contact with Patient 05/26/15 1329     Chief Complaint  Patient presents with  . URI   (Consider location/radiation/quality/duration/timing/severity/associated sxs/prior Treatment) HPI Comments: 24 year old female is complaining of a six-day history of headache, body aches, runny nose, nasal congestion. At the outset she had some vomiting. This has since abated. She is currently not taking any medications and she is breast-feeding.  Patient is a 24 y.o. female presenting with URI.  URI Presenting symptoms: congestion, cough and rhinorrhea   Presenting symptoms: no fatigue, no fever and no sore throat   Associated symptoms: myalgias   Associated symptoms: no neck pain and no wheezing     Past Medical History  Diagnosis Date  . Medical history non-contributory    Past Surgical History  Procedure Laterality Date  . No past surgeries     Family History  Problem Relation Age of Onset  . Hypertension Maternal Grandmother    Social History  Substance Use Topics  . Smoking status: Never Smoker   . Smokeless tobacco: None  . Alcohol Use: No   OB History    Gravida Para Term Preterm AB TAB SAB Ectopic Multiple Living   1 1 1       0 1     Review of Systems  Constitutional: Positive for activity change. Negative for fever, chills, appetite change and fatigue.  HENT: Positive for congestion, postnasal drip and rhinorrhea. Negative for facial swelling and sore throat.   Eyes: Negative.   Respiratory: Positive for cough. Negative for choking, chest tightness and wheezing.   Cardiovascular: Negative.   Musculoskeletal: Positive for myalgias. Negative for neck pain and neck stiffness.  Skin: Negative.  Negative for pallor and rash.  Neurological: Negative.     Allergies  Review of patient's allergies indicates no known allergies.  Home Medications   Prior to Admission medications   Medication  Sig Start Date End Date Taking? Authorizing Provider  ipratropium (ATROVENT) 0.06 % nasal spray Place 2 sprays into both nostrils 4 (four) times daily. 05/26/15   Hayden Rasmussenavid Everton Bertha, NP   Meds Ordered and Administered this Visit  Medications - No data to display  BP 100/65 mmHg  Pulse 110  Temp(Src) 99.2 F (37.3 C) (Oral)  Resp 20  SpO2 100%  LMP 05/07/2015  Breastfeeding? Yes No data found.   Physical Exam  Constitutional: She is oriented to person, place, and time. She appears well-developed and well-nourished. No distress.  HENT:  Mouth/Throat: No oropharyngeal exudate.  Bilateral TMs are normal Oropharynx with minor erythema and clear PND.  Eyes: Conjunctivae and EOM are normal.  Neck: Normal range of motion. Neck supple.  Cardiovascular: Normal rate, regular rhythm and normal heart sounds.   Pulmonary/Chest: Effort normal and breath sounds normal. No respiratory distress. She has no wheezes. She has no rales.  Musculoskeletal: Normal range of motion. She exhibits no edema.  Lymphadenopathy:    She has no cervical adenopathy.  Neurological: She is alert and oriented to person, place, and time. She exhibits normal muscle tone. Coordination normal.  Skin: Skin is warm and dry. No rash noted.  Psychiatric: She has a normal mood and affect.  Nursing note and vitals reviewed.   ED Course  Procedures (including critical care time)  Labs Review Labs Reviewed - No data to display  Imaging Review No results found.   Visual Acuity Review  Right Eye Distance:  Left Eye Distance:   Bilateral Distance:    Right Eye Near:   Left Eye Near:    Bilateral Near:         MDM   1. URI (upper respiratory infection)    Upper Respiratory Infection, Adult May take Claritin for drainage and runny nose. Tylenol for discomfort. May also use copious amounts of saline nasal spray as needed. Drink clear fluids and stay well-hydrated. Atrovent nasal spray for runny nose, sent to  pharmacy.   Hayden Rasmussen, NP 05/26/15 1435

## 2015-05-26 NOTE — Discharge Instructions (Signed)
Infeccin del tracto respiratorio superior, adultos (Upper Respiratory Infection, Adult) May take Claritin for drainage and runny nose. Tylenol for discomfort. May also use copious amounts of saline nasal spray as needed. Drink plenty of fluids and stay well-hydrated.   La mayora de las infecciones del tracto respiratorio superior son infecciones virales de las vas que llevan el aire a los pulmones. Un infeccin del tracto respiratorio superior afecta la nariz, la garganta y las vas respiratorias superiores. El tipo ms frecuente de infeccin del tracto respiratorio superior es la nasofaringitis, que habitualmente se conoce como "resfro comn". Las infecciones del tracto respiratorio superior siguen su curso y por lo general se curan solas. En la International Business Machinesmayora de los casos, la infeccin del tracto respiratorio superior no requiere atencin Atomic Citymdica, Biomedical engineerpero a veces, despus de una infeccin viral, puede surgir una infeccin bacteriana en las vas respiratorias superiores. Esto se conoce como infeccin secundaria. Las infecciones sinusales y en el odo medio son tipos frecuentes de infecciones secundarias en el tracto respiratorio superior. La neumona bacteriana tambin puede complicar un cuadro de infeccin del tracto respiratorio superior. Este tipo de infeccin puede empeorar el asma y la enfermedad pulmonar obstructiva crnica (EPOC). En algunos casos, estas complicaciones pueden requerir atencin mdica de emergencia y poner en peligro la vida.  CAUSAS Casi todas las infecciones del tracto respiratorio superior se deben a los virus. Un virus es un tipo de microbio que puede contagiarse de Neomia Dearuna persona a Educational psychologistotra.  FACTORES DE RIESGO Puede estar en riesgo de sufrir una infeccin del tracto respiratorio superior si:   Fuma.  Tiene una enfermedad pulmonar o cardaca crnica.  Tiene debilitado el sistema de defensa (inmunitario) del cuerpo.  Es [address] [place] [place] Entrancemuy joven o de edad muy Kaib[place]avanzada.  Tiene asma o alergias  nasales.  Trabaja en reas donde hay mucha gente o poca ventilacin.  Rudi Cocorabaja en una escuela o en un centro de atencin mdica. SIGNOS Y SNTOMAS  Habitualmente, los sntomas aparecen de 2a 3das despus de entrar en contacto con el virus del resfro. La mayora de las infecciones virales en el tracto respiratorio superior duran de 7a 10das. Sin embargo, las infecciones virales en el tracto respiratorio superior a causa del virus de la gripe pueden durar de 14a 18das y, habitualmente, son ms graves. Entre los sntomas se pueden incluir los siguientes:   Secrecin o congestin nasal.  Estornudos.  Tos.  Dolor de Advertising copywritergarganta.  Dolor de Turk[place]istancabeza.  Fatiga.  Grant RutsFiebre.  Prdida del apetito.  Dolor en la frente, detrs de los ojos y por encima de los pmulos (dolor sinusal).  Dolores musculares. DIAGNSTICO  El mdico puede diagnosticar una infeccin del tracto respiratorio superior mediante los siguientes estudios:  Examen fsico.  Pruebas para verificar si los sntomas no se deben a otra afeccin, por ejemplo:  Faringitis estreptoccica.  Sinusitis.  Neumona.  Asma. TRATAMIENTO  Esta infeccin desaparece sola, con el tiempo. No puede curarse con medicamentos, pero a menudo se prescriben para aliviar los sntomas. Los medicamentos pueden ser tiles para lo siguiente:  Personal assistantBajar la fiebre.  Reducir la tos.  Aliviar la congestin nasal. INSTRUCCIONES PARA EL CUIDADO EN EL HOGAR   Tome los medicamentos solamente como se lo haya indicado el mdico.  A fin de Engineer, materialsaliviar el dolor de garganta, haga grgaras con solucin salina templada o consuma caramelos para la tos, como se lo haya indicado el mdico.  Use un humidificador de vapor clido o inhale el vapor de la ducha para aumentar la humedad del aire. Esto  facilitar la respiracin.  Beba suficiente lquido para Photographer orina clara o de color amarillo plido.  Consuma sopas y otros caldos transparentes, y [employer].  Descanse todo lo que sea necesario.  Regrese al Aleen Campi cuando la temperatura se le haya normalizado o cuando el mdico lo autorice. Es posible que deba quedarse en su casa durante un tiempo prolongado, para no infectar a los dems. Tambin puede usar un barbijo y lavarse las manos con cuidado para Transport planner propagacin del virus.  Aumente el uso del inhalador si tiene asma.  No consuma ningn producto que contenga tabaco, lo que incluye cigarrillos, tabaco de Theatre manager o Administrator, Civil Service. Si necesita ayuda para dejar de fumar, consulte al [employer]. PREVENCIN  La mejor manera de protegerse de un resfro es mantener una higiene [place].   Evite el contacto oral o fsico con personas que tengan sntomas de resfro.  En caso de contacto, lvese las manos con frecuencia. No hay pruebas claras de que la vitaminaC, la vitaminaE, la equincea o el ejercicio reduzcan la probabilidad de Primary school teacher un resfro. Sin embargo, siempre se recomienda Insurance account manager, hacer ejercicio y Engineering geologist.  SOLICITE ATENCIN MDICA SI:   Su estado empeora en lugar de mejorar.  Los medicamentos no Estate agent.  Tiene escalofros.  La sensacin de falta de aire empeora.  Tiene mucosidad marrn o roja.  Tiene secrecin nasal amarilla o marrn.  Le duele la cara, especialmente al inclinarse hacia adelante.  Tiene fiebre.  Tiene los ganglios del cuello hinchados.  Siente dolor al tragar.  Tiene zonas blancas en la parte de atrs de la garganta. SOLICITE ATENCIN MDICA DE INMEDIATO SI:   Tiene sntomas intensos o persistentes de:  Dolor de [place].  Dolor de odos.  Dolor sinusal.  Dolor en el pecho.  Tiene enfermedad pulmonar crnica y cualquiera de estos sntomas:  Sibilancias.  Tos prolongada.  Tos con sangre.  Cambio en la mucosidad habitual.  Presenta rigidez en el cuello.  Tiene cambios en:  La visin.  La audicin.  El pensamiento.  El  Buna de nimo. ASEGRESE DE QUE:   Comprende estas instrucciones.  Controlar su afeccin.  Recibir ayuda de inmediato si no mejora o si empeora.   Esta informacin no tiene Theme park manager el consejo del mdico. Asegrese de hacerle al mdico cualquier pregunta que tenga.   Document Released: 03/05/2005 Document Revised: 10/10/2014 Elsevier Interactive Patient Education [employer]

## 2015-06-10 NOTE — L&D Delivery Note (Signed)
Patient is 25 y.o. G2P1001 771w4d admitted for PROM at 8:30AM,  labor augmented with Pitocin.   Delivery Note At 9:27 PM a viable female was delivered via Vaginal, Spontaneous Delivery (Presentation: ROA ).  APGAR: 8, 9; weight 7 lb 3.3 oz (3269 g).   Placenta status: intact  Cord: 3 vessels    Anesthesia:   Episiotomy: None Lacerations: None Suture Repair: NA Est. Blood Loss (mL): 100  Mom to postpartum.  Baby to Couplet care / Skin to Skin.  Emily Herman 01/26/2016, 3:59 AM   Upon arrival patient was complete and pushing. She pushed with good maternal effort to deliver a healthy baby. Baby delivered without difficulty, was noted to have good tone and place on maternal abdomen for oral suctioning, drying and stimulation. Delayed cord clamping performed. Placenta delivered intact with 3V cord. Vaginal canal and perineum was inspected. Pitocin was started and uterus massaged until bleeding slowed. Counts of sharps, instruments, and lap pads were all correct.     OB FELLOW DELIVERY ATTESTATION  I was gloved and present for the delivery in its entirety, and I agree with the above resident's note.    Jen MowElizabeth Morry Veiga, DO OB Fellow 01/26/2016 4:20 AM

## 2015-10-18 ENCOUNTER — Encounter (HOSPITAL_COMMUNITY): Payer: Self-pay | Admitting: Obstetrics

## 2015-10-18 LAB — OB RESULTS CONSOLE HIV ANTIBODY (ROUTINE TESTING): HIV: NONREACTIVE

## 2015-10-18 LAB — CYSTIC FIBROSIS DIAGNOSTIC STUDY: Interpretation-CFDNA:: NEGATIVE

## 2015-10-18 LAB — SICKLE CELL SCREEN: Sickle Cell Screen: NORMAL

## 2015-10-18 LAB — OB RESULTS CONSOLE VARICELLA ZOSTER ANTIBODY, IGG: Varicella: IMMUNE

## 2015-10-29 ENCOUNTER — Ambulatory Visit (HOSPITAL_COMMUNITY)
Admission: RE | Admit: 2015-10-29 | Discharge: 2015-10-29 | Disposition: A | Discharge status: Home / Self Care | Payer: Self-pay | Source: Ambulatory Visit | Attending: Obstetrics | Admitting: Obstetrics

## 2015-10-29 ENCOUNTER — Other Ambulatory Visit (HOSPITAL_COMMUNITY): Payer: Self-pay | Admitting: Physician Assistant

## 2015-10-29 ENCOUNTER — Encounter (HOSPITAL_COMMUNITY): Payer: Self-pay

## 2015-10-29 DIAGNOSIS — Z3A25 25 weeks gestation of pregnancy: Secondary | ICD-10-CM | POA: Insufficient documentation

## 2015-10-29 DIAGNOSIS — Z36 Encounter for antenatal screening of mother: Secondary | ICD-10-CM | POA: Insufficient documentation

## 2015-10-29 DIAGNOSIS — Z3689 Encounter for other specified antenatal screening: Secondary | ICD-10-CM

## 2016-01-25 ENCOUNTER — Encounter (HOSPITAL_COMMUNITY): Payer: Self-pay

## 2016-01-25 ENCOUNTER — Inpatient Hospital Stay (HOSPITAL_COMMUNITY)
Admission: AD | Admit: 2016-01-25 | Discharge: 2016-01-27 | DRG: 775 | Disposition: A | Discharge status: Home / Self Care | Payer: Medicaid Other | Source: Ambulatory Visit | Attending: Obstetrics and Gynecology | Admitting: Obstetrics and Gynecology

## 2016-01-25 DIAGNOSIS — O4202 Full-term premature rupture of membranes, onset of labor within 24 hours of rupture: Secondary | ICD-10-CM | POA: Diagnosis present

## 2016-01-25 DIAGNOSIS — Z3A37 37 weeks gestation of pregnancy: Secondary | ICD-10-CM | POA: Diagnosis not present

## 2016-01-25 DIAGNOSIS — Z8249 Family history of ischemic heart disease and other diseases of the circulatory system: Secondary | ICD-10-CM | POA: Diagnosis not present

## 2016-01-25 DIAGNOSIS — O4290 Premature rupture of membranes, unspecified as to length of time between rupture and onset of labor, unspecified weeks of gestation: Secondary | ICD-10-CM | POA: Diagnosis present

## 2016-01-25 LAB — CBC
HCT: 35.4 % — ABNORMAL LOW (ref 36.0–46.0)
HCT: 34.4 % — ABNORMAL LOW (ref 36.0–46.0)
Hemoglobin: 11.8 g/dL — ABNORMAL LOW (ref 12.0–15.0)
Hemoglobin: 11.6 g/dL — ABNORMAL LOW (ref 12.0–15.0)
MCH: 26 pg (ref 26.0–34.0)
MCH: 26.5 pg (ref 26.0–34.0)
MCHC: 33.3 g/dL (ref 30.0–36.0)
MCHC: 33.7 g/dL (ref 30.0–36.0)
MCV: 78.1 fL (ref 78.0–100.0)
MCV: 78.5 fL (ref 78.0–100.0)
PLATELETS: 313 10*3/uL (ref 150–400)
PLATELETS: 296 10*3/uL (ref 150–400)
RBC: 4.53 MIL/uL (ref 3.87–5.11)
RBC: 4.38 MIL/uL (ref 3.87–5.11)
RDW: 13.4 % (ref 11.5–15.5)
RDW: 13.3 % (ref 11.5–15.5)
WBC: 8.8 10*3/uL (ref 4.0–10.5)
WBC: 10.7 10*3/uL — ABNORMAL HIGH (ref 4.0–10.5)

## 2016-01-25 LAB — TYPE AND SCREEN
ABO/RH(D): O POS
Antibody Screen: NEGATIVE

## 2016-01-25 LAB — GROUP B STREP BY PCR: Group B strep by PCR: NEGATIVE

## 2016-01-25 MED ORDER — OXYCODONE-ACETAMINOPHEN 5-325 MG PO TABS: 1.0000 | ORAL_TABLET | ORAL | Status: DC | PRN

## 2016-01-25 MED ORDER — SENNOSIDES-DOCUSATE SODIUM 8.6-50 MG PO TABS
2.0000 | ORAL_TABLET | ORAL | Status: DC
  Administered 2016-01-26 (×2): 2 via ORAL
  Filled 2016-01-25 (×2): qty 2

## 2016-01-25 MED ORDER — DIPHENHYDRAMINE HCL 25 MG PO CAPS: 25.0000 mg | ORAL_CAPSULE | Freq: Four times a day (QID) | ORAL | Status: DC | PRN

## 2016-01-25 MED ORDER — OXYTOCIN BOLUS FROM INFUSION: 500.0000 mL | Freq: Once | INTRAVENOUS | Status: DC

## 2016-01-25 MED ORDER — COCONUT OIL OIL: 1.0000 "application " | TOPICAL_OIL | Status: DC | PRN

## 2016-01-25 MED ORDER — ZOLPIDEM TARTRATE 5 MG PO TABS: 5.0000 mg | ORAL_TABLET | Freq: Every evening | ORAL | Status: DC | PRN

## 2016-01-25 MED ORDER — LACTATED RINGERS IV SOLN: INTRAVENOUS | Status: DC

## 2016-01-25 MED ORDER — PRENATAL MULTIVITAMIN CH
1.0000 | ORAL_TABLET | Freq: Every day | ORAL | Status: DC
Start: 2016-01-26 — End: 2016-01-27
  Administered 2016-01-26 – 2016-01-27 (×2): 1 via ORAL
  Filled 2016-01-25 (×2): qty 1

## 2016-01-25 MED ORDER — LIDOCAINE HCL (PF) 1 % IJ SOLN
30.0000 mL | INTRAMUSCULAR | Status: DC | PRN
  Filled 2016-01-25: qty 30

## 2016-01-25 MED ORDER — OXYCODONE-ACETAMINOPHEN 5-325 MG PO TABS: 2.0000 | ORAL_TABLET | ORAL | Status: DC | PRN

## 2016-01-25 MED ORDER — OXYTOCIN 40 UNITS IN LACTATED RINGERS INFUSION - SIMPLE MED
1.0000 m[IU]/min | INTRAVENOUS | Status: DC
  Administered 2016-01-25: 2 m[IU]/min via INTRAVENOUS

## 2016-01-25 MED ORDER — IBUPROFEN 600 MG PO TABS
600.0000 mg | ORAL_TABLET | Freq: Four times a day (QID) | ORAL | Status: DC
  Administered 2016-01-26 – 2016-01-27 (×7): 600 mg via ORAL
  Filled 2016-01-25 (×7): qty 1

## 2016-01-25 MED ORDER — ACETAMINOPHEN 325 MG PO TABS: 650.0000 mg | ORAL_TABLET | ORAL | Status: DC | PRN

## 2016-01-25 MED ORDER — ONDANSETRON HCL 4 MG/2ML IJ SOLN: 4.0000 mg | Freq: Four times a day (QID) | INTRAMUSCULAR | Status: DC | PRN

## 2016-01-25 MED ORDER — SOD CITRATE-CITRIC ACID 500-334 MG/5ML PO SOLN: 30.0000 mL | ORAL | Status: DC | PRN

## 2016-01-25 MED ORDER — TETANUS-DIPHTH-ACELL PERTUSSIS 5-2.5-18.5 LF-MCG/0.5 IM SUSP: 0.5000 mL | Freq: Once | INTRAMUSCULAR | Status: DC

## 2016-01-25 MED ORDER — ONDANSETRON HCL 4 MG/2ML IJ SOLN: 4.0000 mg | INTRAMUSCULAR | Status: DC | PRN

## 2016-01-25 MED ORDER — FLEET ENEMA 7-19 GM/118ML RE ENEM: 1.0000 | ENEMA | RECTAL | Status: DC | PRN

## 2016-01-25 MED ORDER — DIBUCAINE 1 % RE OINT: 1.0000 "application " | TOPICAL_OINTMENT | RECTAL | Status: DC | PRN

## 2016-01-25 MED ORDER — FENTANYL CITRATE (PF) 100 MCG/2ML IJ SOLN: 100.0000 ug | INTRAMUSCULAR | Status: DC | PRN

## 2016-01-25 MED ORDER — ONDANSETRON HCL 4 MG PO TABS: 4.0000 mg | ORAL_TABLET | ORAL | Status: DC | PRN

## 2016-01-25 MED ORDER — OXYTOCIN 40 UNITS IN LACTATED RINGERS INFUSION - SIMPLE MED: 2.5000 [IU]/h | INTRAVENOUS | Status: DC

## 2016-01-25 MED ORDER — LACTATED RINGERS IV SOLN: 500.0000 mL | INTRAVENOUS | Status: DC | PRN

## 2016-01-25 MED ORDER — WITCH HAZEL-GLYCERIN EX PADS: 1.0000 "application " | MEDICATED_PAD | CUTANEOUS | Status: DC | PRN

## 2016-01-25 MED ORDER — TERBUTALINE SULFATE 1 MG/ML IJ SOLN
0.2500 mg | Freq: Once | INTRAMUSCULAR | Status: DC | PRN
  Filled 2016-01-25: qty 1

## 2016-01-25 MED ORDER — OXYTOCIN 40 UNITS IN LACTATED RINGERS INFUSION - SIMPLE MED
2.5000 [IU]/h | INTRAVENOUS | Status: DC
  Filled 2016-01-25: qty 1000

## 2016-01-25 MED ORDER — SIMETHICONE 80 MG PO CHEW: 80.0000 mg | CHEWABLE_TABLET | ORAL | Status: DC | PRN

## 2016-01-25 MED ORDER — BENZOCAINE-MENTHOL 20-0.5 % EX AERO: 1.0000 "application " | INHALATION_SPRAY | CUTANEOUS | Status: DC | PRN

## 2016-01-25 NOTE — MAU Note (Signed)
Fern positive. 

## 2016-01-25 NOTE — MAU Note (Signed)
This morning had vaginal discharge clear which was a lot has a pad on having intermittent contractions, was [facility] Closed.

## 2016-01-25 NOTE — Anesthesia Pain Management Evaluation Note (Signed)
  CRNA Pain Management Visit Note  [facility]: [name], 25 y.o., female  "Hello I am a [facility] the [facility]. We have an anesthesia team available at all times to provide care throughout the hospital, including epidural management and anesthesia for C-section. I don't know your plan for the delivery whether it a natural birth, water birth, IV sedation, nitrous supplementation, doula or epidural, but we want to meet your pain goals."   1.Was your pain managed to your expectations on prior hospitalizations?   Yes   2.What is your expectation for pain management during this hospitalization?     IV pain meds  3.How can we help you reach that goal? Be available. RN at bedside, patient requesting IV pain meds  Record the patient's initial score and the patient's pain goal.   Pain: 9  Pain Goal: 9 [facility] wants you to be able to say your pain was always managed very well.  [facility] HospitalMERRITT,Emily Russell 01/25/2016

## 2016-01-26 LAB — CBC
HEMATOCRIT: 33.9 % — AB (ref 36.0–46.0)
Hemoglobin: 11.3 g/dL — ABNORMAL LOW (ref 12.0–15.0)
MCH: 26 pg (ref 26.0–34.0)
MCHC: 33.3 g/dL (ref 30.0–36.0)
MCV: 77.9 fL — AB (ref 78.0–100.0)
PLATELETS: 290 10*3/uL (ref 150–400)
RBC: 4.35 MIL/uL (ref 3.87–5.11)
RDW: 13.3 % (ref 11.5–15.5)
WBC: 12.4 10*3/uL — AB (ref 4.0–10.5)

## 2016-01-26 LAB — RPR: RPR Ser Ql: NONREACTIVE

## 2016-01-26 NOTE — Progress Notes (Signed)
Post Partum Day 1 Subjective: Patient reports no pain at present and says she has been mobile. She has voided but not stooled/passed gas. She reports her throat is hurting and she might have some nasal congestion. She denies headache, N/V, or blurry vision. She is tolerating PO intake. She plans on a combination of breast/bottle feeding.  Objective: Blood pressure (!) 94/58, pulse 80, temperature 98.1 F (36.7 C), temperature source Oral, resp. rate 16, height 5\' 1"  (1.549 m), weight 66.7 kg (147 lb), last menstrual period 05/07/2015, unknown if currently breastfeeding.  Physical Exam:  General: alert, cooperative and no distress Resp: no respiratory Distress CV regular rate, distal pulses intact trace swelling Lochia: appropriate Uterine Fundus: firm Incision: N/A   Recent Labs  01/25/16 2043 01/26/16 0610  HGB 11.6* 11.3*  HCT 34.4* 33.9*    Assessment/Plan: Plan for discharge tomorrow. Patient will receive Nexplanon for contraception at her 6 week outpatient follow up visit.   LOS: 1 day         Ernestina PennaNicholas Lyrah Bradt MD

## 2016-01-26 NOTE — H&P (Signed)
LABOR AND DELIVERY ADMISSION HISTORY AND PHYSICAL NOTE  Emily Herman is a 25 y.o. female G2P2002 with IUP at 5861w4d  presenting for SROM/PROM. Occurred this AM around 8:30AM. Was not having contractions, and ctx afterwards have decreased.   She reports positive fetal movement. She denies leakage of fluid or vaginal bleeding.  Prenatal History/Complications:  Past Medical History: Past Medical History:  Diagnosis Date  . Medical history non-contributory     Past Surgical History: Past Surgical History:  Procedure Laterality Date  . NO PAST SURGERIES      Obstetrical History: OB History    Gravida Para Term Preterm AB Living   2 2 2     2    SAB TAB Ectopic Multiple Live Births         0 2      Social History: Social History   Social History  . Marital status: Married    Spouse name: N/A  . Number of children: N/A  . Years of education: N/A   Social History Main Topics  . Smoking status: Never Smoker  . Smokeless tobacco: Never Used  . Alcohol use No  . Drug use: No  . Sexual activity: Yes    Birth control/ protection: None   Other Topics Concern  . None   Social History Narrative  . None    Family History: Family History  Problem Relation Age of Onset  . Hypertension Maternal Grandmother     Allergies: No Known Allergies  Prescriptions Prior to Admission  Medication Sig Dispense Refill Last Dose  . acetaminophen (TYLENOL) 325 MG tablet Take 325 mg by mouth daily as needed for mild pain or headache.   Past Week at Unknown time  . Prenatal Vit-Fe Fumarate-FA (PRENATAL MULTIVITAMIN) TABS tablet Take 1 tablet by mouth daily at 12 noon.   01/24/2016 at Unknown time     Review of Systems   All systems reviewed and negative except as stated in HPI  Blood pressure (!) 99/58, pulse 88, temperature 98.5 F (36.9 C), temperature source Oral, resp. rate 16, height 5\' 1"  (1.549 m), weight 147 lb (66.7 kg), last menstrual period 05/07/2015,  unknown if currently breastfeeding. General appearance: alert, cooperative and appears stated age Lungs: clear to auscultation bilaterally Heart: regular rate and rhythm Abdomen: soft, non-tender; bowel sounds normal Extremities: No calf swelling or tenderness Presentation: cephalic Fetal monitoring: 130, mod var, +accels, no decels Uterine activity: irregular, q4-8 min Dilation: 5 Effacement (%): 60 Station: -1 Exam by:: Rolene Arbouresmaine Lewis, RN   Prenatal labs: ABO, Rh: --/--/O POS (08/18 1258) Antibody: NEG (08/18 1258) Rubella: !Error! RPR: Non Reactive (08/18 1258)  HBsAg:    HIV:    GBS:    1 hr Glucola: 104 Genetic screening:  Declined Anatomy US: Normal  Prenatal Transfer Tool  Maternal Diabetes: No Genetic Screening: Declined Maternal Ultrasounds/Referrals: Normal Fetal Ultrasounds or other Referrals:  None Maternal Substance Abuse:  No Significant Maternal Medications:  None Significant Maternal Lab Results: None  Results for orders placed or performed during the hospital encounter of 01/25/16 (from the past 24 hour(s))  CBC   Collection Time: 01/25/16 12:58 PM  Result Value Ref Range   WBC 8.8 4.0 - 10.5 K/uL   RBC 4.53 3.87 - 5.11 MIL/uL   Hemoglobin 11.8 (L) 12.0 - 15.0 g/dL   HCT 29.535.4 (L) 62.136.0 - 30.846.0 %   MCV 78.1 78.0 - 100.0 fL   MCH 26.0 26.0 - 34.0 pg   MCHC 33.3 30.0 -  36.0 g/dL   RDW 16.113.4 09.611.5 - 04.515.5 %   Platelets 313 150 - 400 K/uL  RPR   Collection Time: 01/25/16 12:58 PM  Result Value Ref Range   RPR Ser Ql Non Reactive Non Reactive  Type and screen [facility]   Collection Time: 01/25/16 12:58 PM  Result Value Ref Range   ABO/RH(D) O POS    Antibody Screen NEG    Sample Expiration 01/28/2016   Group B strep by PCR   Collection Time: 01/25/16  8:26 PM  Result Value Ref Range   Group B strep by PCR NEGATIVE NEGATIVE  CBC   Collection Time: 01/25/16  8:43 PM  Result Value Ref Range   WBC 10.7 (H) 4.0 - 10.5 K/uL   RBC  4.38 3.87 - 5.11 MIL/uL   Hemoglobin 11.6 (L) 12.0 - 15.0 g/dL   HCT 40.934.4 (L) 81.136.0 - 91.446.0 %   MCV 78.5 78.0 - 100.0 fL   MCH 26.5 26.0 - 34.0 pg   MCHC 33.7 30.0 - 36.0 g/dL   RDW 78.213.3 95.611.5 - 21.315.5 %   Platelets 296 150 - 400 K/uL    Patient Active Problem List   Diagnosis Date Noted  . PROM (premature rupture of membranes) 01/25/2016  . Normal labor 01/25/2016  . Vaginal delivery 01/25/2016  . Pregnancy 09/25/2014  . NVD (normal vaginal delivery) 09/25/2014  . Encounter for fetal anatomic survey   . Placenta previa antepartum in third trimester   . [redacted] weeks gestation of pregnancy   . Contraception management 10/06/2013    Assessment: Emily Herman is a 25 y.o. G2P2002 at 5863w4d here for SROM/PROM.  #Labor: Expectant management with augmentation if needed #Pain: Epidural when requested #FWB: Cat I #ID:  GBS pending #MOF: Both #MOC:Nexplanon #Circ:  N/A - girl  Jen MowElizabeth Daneil Beem, DO 01/25/16, 1:45PM

## 2016-01-26 NOTE — Lactation Note (Signed)
This note was copied from a baby's chart. Lactation Consultation Note: Mother is an experienced breastfeeding mother . She breastfed her first child for 7 months. Mother reports that her infant is feeding well. She was reminded that she needed to feed infant 8-12 times in 24 hours. Mother advised to limit swaddling and do more skin to skin to observed feeding cues. Advised that infant will cluster feed and that this is normal.  Mother receptive to all teaching. Advised to review information in baby and me book.   Patient Name: Girl [place] Dominguez-Moreno Today's Date: 01/26/2016 Reason for consult: Initial assessment   Maternal Data    Feeding Feeding Type: Formula Nipple Type: Slow - flow  LATCH Score/Interventions                      Lactation Tools Discussed/Used     Consult Status Consult Status: Follow-up Date: 01/26/16 Follow-up type: In-patient    Stevan BornKendrick, Rachael Zapanta [facility] 01/26/2016, 12:04 PM

## 2016-01-26 NOTE — Progress Notes (Signed)
Post Partum Day 1 Subjective: Patient reports no pain at present and says she has been mobile. She has voided but not stooled/passed gas. She reports her throat is hurting and she might have some nasal congestion. She denies headache, N/V, or blurry vision. She is tolerating PO intake. She plans on a combination of breast/bottle feeding.  Objective: Blood pressure (!) 94/58, pulse 80, temperature 98.1 F (36.7 C), temperature source Oral, resp. rate 16, height 5\' 1"  (1.549 m), weight 66.7 kg (147 lb), last menstrual period 05/07/2015, unknown if currently breastfeeding.  Physical Exam:  General: alert, cooperative and no distress Lochia: appropriate Uterine Fundus: firm Incision: N/A   Recent Labs  01/25/16 2043 01/26/16 0610  HGB 11.6* 11.3*  HCT 34.4* 33.9*    Assessment/Plan: Plan for discharge tomorrow. Patient will receive Nexplanon for contraception at her 6 week outpatient follow up visit.   LOS: 1 day   Emily MoralesSharon Herman 01/26/2016, 7:47 AM   OB FELLOW MEDICAL STUDENT NOTE ATTESTATION  I have seen and examined this patient. Note this is a Psychologist, occupationalmedical student note and as such does not necessarily reflect the patient's plan of care. Please see progress note by Ernestina PennaNicholas Emmauel Hallums MD for this date of service.    Ernestina Pennaicholas Lillymae Duet 01/26/2016, 10:17 AM

## 2016-01-27 MED ORDER — MENTHOL 3 MG MT LOZG
1.0000 | LOZENGE | OROMUCOSAL | Status: DC | PRN
  Filled 2016-01-27: qty 9

## 2016-01-27 MED ORDER — SALINE SPRAY 0.65 % NA SOLN
1.0000 | NASAL | Status: DC | PRN
  Administered 2016-01-27: 1 via NASAL
  Filled 2016-01-27: qty 44

## 2016-01-27 MED ORDER — IBUPROFEN 600 MG PO TABS: 600.0000 mg | ORAL_TABLET | Freq: Four times a day (QID) | ORAL | 1 refills | Status: DC

## 2016-01-27 MED ORDER — SENNOSIDES-DOCUSATE SODIUM 8.6-50 MG PO TABS: 2.0000 | ORAL_TABLET | ORAL | 0 refills | Status: DC

## 2016-01-27 NOTE — Discharge Summary (Signed)
OB Discharge Summary     Patient Name: Emily Herman DOB: [birth year] MRN: [id number]  Date of admission: 01/25/2016 Delivering MD: Michaele OfferMUMAW, ELIZABETH WOODLAND   Date of discharge: 01/27/2016  Admitting diagnosis: 37WKS, DISCHARGE,FLUID Intrauterine pregnancy: 2182w4d     Secondary diagnosis:  Active Problems:   PROM (premature rupture of membranes)   Normal labor   Vaginal delivery  Additional problems: none     Discharge diagnosis: Term Pregnancy Delivered                                                                                                Post partum procedures:  Augmentation: Pitocin  Complications: None  Hospital course:  Onset of Labor With Vaginal Delivery     25 y.o. yo [id number] at 5182w4d was admitted in Latent Labor and PROM on 01/25/2016. Patient had an uncomplicated labor course as follows:  Membrane Rupture Time/Date: 8:30 AM ,01/25/2016   Intrapartum Procedures: Episiotomy: None [1]                                         Lacerations:  None [1]  Patient had a delivery of a Viable infant. 01/25/2016  Information for the patient's newborn:  Gwendlyn DeutscherDominguez-Moreno, Girl [place] [[id number]][[id number]]  Delivery Method: Vaginal, Spontaneous Delivery (Filed from Delivery Summary)    Pateint had an uncomplicated postpartum course.  She is ambulating, tolerating a regular diet, passing flatus, and urinating well. Patient is discharged home in stable condition on 01/27/16.    Physical exam Vitals:   01/26/16 0514 01/26/16 1800 01/27/16 0515 01/27/16 0520  BP: (!) 94/58 (!) 90/59 (!) 86/62 (!) 83/45  Pulse: 80 78 92 75  Resp: 16  16   Temp: 98.1 F (36.7 C) 98.6 F (37 C) 97.9 F (36.6 C)   TempSrc: Oral  Oral   SpO2:  96%    Weight:      Height:       General: alert Lochia: appropriate Uterine Fundus: firm Incision: No significant erythema, N/A DVT Evaluation: No evidence of DVT seen on physical exam. Labs: Lab Results  Component Value Date   WBC 12.4  (H) 01/26/2016   HGB 11.3 (L) 01/26/2016   HCT 33.9 (L) 01/26/2016   MCV 77.9 (L) 01/26/2016   PLT 290 01/26/2016   No flowsheet data found.  Discharge instruction: per After Visit Summary and "Baby and Me Booklet".  After visit meds:    Medication List    TAKE these medications   acetaminophen 325 MG tablet Commonly known as:  TYLENOL Take 325 mg by mouth daily as needed for mild pain or headache.   ibuprofen 600 MG tablet Commonly known as:  ADVIL,MOTRIN Take 1 tablet (600 mg total) by mouth every 6 (six) hours.   prenatal multivitamin Tabs tablet Take 1 tablet by mouth daily at 12 noon.   senna-docusate 8.6-50 MG tablet Commonly known as:  Senokot-S Take 2 tablets by mouth daily.       Diet: routine diet  Activity:  Advance as tolerated. Pelvic rest for 6 weeks.   Outpatient follow up:6 weeks Follow up Appt:No future appointments. Follow up Visit:No Follow-up on file.  Postpartum contraception: Nexplanon  Newborn Data: Live born female  Birth Weight: 7 lb 3.3 oz (3269 g) APGAR: 8, 9  Baby Feeding: Bottle and Breast Disposition:home with mother   01/27/2016 Asiyah Angelene GiovanniZ Mikell, MD   OB FELLOW DISCHARGE ATTESTATION  I have seen and examined this patient and agree with above documentation in the resident's note.   Jen MowElizabeth Mumaw, DO OB Fellow 2:33 AM

## 2016-01-27 NOTE — Lactation Note (Signed)
Lactation Consultation Note  Patient Name: [place] Dominguez-Moreno Today's Date: 01/27/2016  Mom states she puts baby to breast first and then gives formula.  She feels she doesn't have enough milk for baby.  Mom will mostly exclusively breastfeed when milk comes in.  Reviewed outpatient lactation services and support.  Encouraged to call prn.   Maternal Data    Feeding    LATCH Score/Interventions                      Lactation Tools Discussed/Used     Consult Status      Huston FoleyMOULDEN, [place] S 01/27/2016, 10:19 AM

## 2016-03-04 ENCOUNTER — Encounter: Payer: Self-pay | Admitting: *Deleted

## 2016-03-07 NOTE — Progress Notes (Signed)
Post discharge chart review completed.  

## 2016-09-10 ENCOUNTER — Encounter (INDEPENDENT_AMBULATORY_CARE_PROVIDER_SITE_OTHER): Payer: Self-pay | Admitting: Physician Assistant

## 2016-09-10 ENCOUNTER — Ambulatory Visit (INDEPENDENT_AMBULATORY_CARE_PROVIDER_SITE_OTHER): Payer: Self-pay | Admitting: Physician Assistant

## 2016-09-10 VITALS — BP 96/67 | HR 99 | Temp 98.2°F | Ht 60.24 in | Wt 134.2 lb

## 2016-09-10 DIAGNOSIS — J039 Acute tonsillitis, unspecified: Secondary | ICD-10-CM

## 2016-09-10 LAB — POCT RAPID STREP A (OFFICE): RAPID STREP A SCREEN: NEGATIVE

## 2016-09-10 MED ORDER — AMOXICILLIN-POT CLAVULANATE 875-125 MG PO TABS: 1.0000 | ORAL_TABLET | Freq: Two times a day (BID) | ORAL | 0 refills | Status: AC

## 2016-09-10 MED ORDER — PREDNISONE 20 MG PO TABS: 40.0000 mg | ORAL_TABLET | Freq: Every day | ORAL | 0 refills | Status: DC

## 2016-09-10 NOTE — Patient Instructions (Signed)
Amigdalitis (Tonsillitis) La amigdalitis es una infeccin de la garganta que hace que las amgdalas se tornen rojas, sensibles e hinchadas. Las amgdalas son colecciones de tejido linftico que se encuentran el la zona posterior de la garganta. Cada amgdala tiene grietas (criptas). Ayudan a luchar contra las infecciones de la nariz y la garganta, y a evitar que las infecciones se diseminen a otras partes del cuerpo durante los primeros 18 meses de vida. CAUSAS Por lo general, la causa de la amigdalitis sbita (aguda) es una infeccin por la bacteria estreptococo. La amigdalitis de larga duracin (crnica) se produce cuando las criptas de las amgdalas se llenan con trozos de alimentos y bacterias, lo que favorece las infecciones constantes. SNTOMAS Los sntomas de la amigdalitis son:  Dolor de garganta con posible dificultad para tragar.  Placas blancas sobre las amgdalas.  Fiebre.  Cansancio.  Episodios de ronquidos durante el sueo, cuando no los tena anteriormente.  Pequeos trozos de material blanco amarillento (tonsilolitos), de olor ftido, que de vez en cuando se eliminan al toser o escupir. Los tonsilolitos tambin pueden causarle mal aliento. DIAGNSTICO El diagnstico puede hacerse a travs de un examen fsico. Se confirma con los resultados de las pruebas de laboratorio, incluido un cultivo de secreciones de la garganta. TRATAMIENTO Los objetivos del tratamiento de la amigdalitis son la reduccin de la gravedad y duracin de los sntomas y prevencin de enfermedades asociadas. Los sntomas pueden mejorar con el uso de corticoides para reducir la hinchazn. La amigdalitis bacteriana se puede tratar con medicamentos antibiticos. Generalmente, el tratamiento con medicamentos antibiticos comienza antes de conocerse la causa. Sin embargo, si se determina que la causa no es bacteriana, los medicamentos antibiticos no curarn la enfermedad. Si los ataques de amigdalitis son graves y  frecuentes, el mdico le recomendar la ciruga para extirpar las amgdalas (amigdalectoma). INSTRUCCIONES PARA EL CUIDADO EN EL HOGAR  Descanse y duerma todo lo posible.  Beba abundantes lquidos. Mientras le duela la garganta, consuma alimentos blandos o lquidos, como sorbetes, sopas o bebidas instantneas.  Tome helados de agua.  Puede hacerse grgaras con lquidos tibios o fros para suavizar la garganta. Mezcle 1/4 de cucharadita de sal y 1/4 de cucharadita de bicarbonato de sodio en 8 onzas de agua.  SOLICITE ATENCIN MDICA SI:  Le aparecen bultos grandes y dolorosos en el cuello.  Aparece una erupcin cutnea.  Elimina un esputo verde, marrn amarillento o sanguinolento.  No puede tragar lquidos o alimentos durante 24 horas.  Nota que solo una de las amgdalas est hinchada.  SOLICITE ATENCIN MDICA DE INMEDIATO SI:  Presenta algn sntoma nuevo, como vmitos, dolor de cabeza intenso, rigidez en el cuello, dolor en el pecho, problemas respiratorios o dificultad para tragar.  Comienza a sentir dolor de garganta ms intenso junto con babeo o cambios en la voz.  Siente un dolor intenso, que no se alivia con los medicamentos que le han recomendado.  No puede abrir completamente la boca.  Siente un dolor intenso, hinchazn o enrojecimiento en el cuello.  Tiene fiebre.  ASEGRESE DE QUE:  Comprende estas instrucciones.  Controlar su afeccin.  Recibir ayuda de inmediato si no mejora o si empeora.  Esta informacin no tiene como fin reemplazar el consejo del mdico. Asegrese de hacerle al mdico cualquier pregunta que tenga. Document Released: 03/05/2005 Document Revised: 05/31/2013 Document Reviewed: 11/12/2012 Elsevier Interactive Patient Education  2017 [employer] Inc[employer]  

## 2016-09-10 NOTE — Progress Notes (Signed)
Pt presents today as a New Patient PT has concerns about her tonsils Pt has runny nose and other cold symptoms, possibly due to allergies Pt drank Theraflu tea, that helped but she cant get rid of the runny nose  Pt states she has problems every 2-3 weeks with her tonsils  PT states she has felt swollen glands at times  Pt believes as a child she swallowed a fish bone, possibly the cause of the pain

## 2016-09-10 NOTE — Progress Notes (Signed)
Subjective:  Patient ID: Emily Herman, female    DOB: 1990/10/30  Age: 26 y.o. MRN: [id number]  CC: tonsillitis  HPI Emily Herman is a 26 y.o. female with a PMH significant for Tonsillitis presents with tonsillar pain and swelling. Reports having more than six episodes of tonsillitis per year that require medical attention. Rest of the year has tonsillar discomfort and swelling. Very sensitive to cold/flu/allergies. Feels that even the [facility] of her vehicle causes her tonsils to swell. No current constitutional symptoms. Denies rash, swelling elsewhere in the body, chest pain, SOB, abdominal pain, nausea, vomiting, or GI/GU sxs.     Review of Systems  Constitutional: Negative for chills, fever and malaise/fatigue.  HENT: Positive for congestion (ears feel congested) and sore throat. Negative for ear pain and sinus pain.   Eyes: Negative for blurred vision and pain.  Respiratory: Negative for cough, sputum production and shortness of breath.   Cardiovascular: Negative for chest pain and palpitations.  Gastrointestinal: Negative for abdominal pain and nausea.  Genitourinary: Negative for dysuria and hematuria.  Musculoskeletal: Negative for joint pain and myalgias.  Skin: Negative for rash.  Neurological: Negative for tingling and headaches.  Psychiatric/Behavioral: Negative for depression. The patient is not nervous/anxious.     Objective:  BP 96/67 (BP Location: Right Arm, Patient Position: Sitting, Cuff Size: Normal)   Pulse 99   Temp 98.2 F (36.8 C) (Oral)   Ht 5' 0.24" (1.53 m)   Wt 134 lb 3.2 oz (60.9 kg)   LMP 08/26/2016 (Exact Date)   SpO2 95%   Breastfeeding? No   BMI 26.00 kg/m   BP/Weight 09/10/2016 01/27/2016 01/25/2016  Systolic BP 96 83 -  Diastolic BP 67 45 -  Wt. (Lbs) 134.2 - 147  BMI 26 - 27.78      Physical Exam  Constitutional: She is oriented to person, place, and time.  Well developed, well nourished, NAD, polite  HENT:   Head: Normocephalic and atraumatic.  Tonsils 3+ and injected. Two small white exudates seen on left tonsil  Eyes: No scleral icterus.  Neck: Normal range of motion. Neck supple. No thyromegaly present.  Cardiovascular: Normal rate, regular rhythm and normal heart sounds.   Pulmonary/Chest: Effort normal and breath sounds normal.  Musculoskeletal: She exhibits no edema.  Lymphadenopathy:    She has no cervical adenopathy.  Neurological: She is alert and oriented to person, place, and time.  Skin: Skin is warm and dry. No rash noted. No erythema. No pallor.  Psychiatric: She has a normal mood and affect. Her behavior is normal. Thought content normal.  Vitals reviewed.    Assessment & Plan:   1. Acute tonsillitis, unspecified etiology - Rapid Strep A negative in clinic today - Culture, Group A Strep - Ambulatory referral to ENT for possible tonsillectomy  - amoxicillin-clavulanate (AUGMENTIN) 875-125 MG tablet; Take 1 tablet by mouth 2 (two) times daily.  Dispense: 20 tablet; Refill: 0 - predniSONE (DELTASONE) 20 MG tablet; Take 2 tablets (40 mg total) by mouth daily with breakfast.  Dispense: 10 tablet; Refill: 0   Meds ordered this encounter  Medications  . amoxicillin-clavulanate (AUGMENTIN) 875-125 MG tablet    Sig: Take 1 tablet by mouth 2 (two) times daily.    Dispense:  20 tablet    Refill:  0    Order Specific Question:   Supervising Provider    Answer:   Quentin Angst [id number]  . predniSONE (DELTASONE) 20 MG tablet    Sig: Take 2 tablets (  40 mg total) by mouth daily with breakfast.    Dispense:  10 tablet    Refill:  0    Order Specific Question:   Supervising Provider    Answer:   Quentin Angst [[id number]]    Follow-up: Return if symptoms worsen or fail to improve.   Loletta Specter PA

## 2016-09-12 LAB — CULTURE, GROUP A STREP: STREP A CULTURE: NEGATIVE

## 2016-09-26 ENCOUNTER — Ambulatory Visit (INDEPENDENT_AMBULATORY_CARE_PROVIDER_SITE_OTHER): Payer: Medicaid Other

## 2019-12-22 LAB — OB RESULTS CONSOLE ANTIBODY SCREEN: Antibody Screen: NEGATIVE

## 2019-12-22 LAB — OB RESULTS CONSOLE GC/CHLAMYDIA
Chlamydia: NEGATIVE
Gonorrhea: NEGATIVE

## 2019-12-22 LAB — HEPATITIS C ANTIBODY: HCV Ab: NEGATIVE

## 2019-12-22 LAB — GLUCOSE TOLERANCE, 1 HOUR: Glucose, 1 Hour GTT: 90

## 2019-12-22 LAB — OB RESULTS CONSOLE HEPATITIS B SURFACE ANTIGEN: Hepatitis B Surface Ag: NEGATIVE

## 2019-12-22 LAB — OB RESULTS CONSOLE ABO/RH: RH Type: POSITIVE

## 2019-12-22 LAB — OB RESULTS CONSOLE HGB/HCT, BLOOD
HCT: 35 (ref 29–41)
Hemoglobin: 11.4

## 2019-12-22 LAB — OB RESULTS CONSOLE RUBELLA ANTIBODY, IGM: Rubella: IMMUNE

## 2019-12-22 LAB — OB RESULTS CONSOLE HIV ANTIBODY (ROUTINE TESTING): HIV: NONREACTIVE

## 2019-12-22 LAB — OB RESULTS CONSOLE PLATELET COUNT: Platelets: 346

## 2019-12-22 LAB — OB RESULTS CONSOLE RPR: RPR: NONREACTIVE

## 2020-01-25 ENCOUNTER — Encounter: Payer: Self-pay | Admitting: General Practice

## 2020-01-25 ENCOUNTER — Ambulatory Visit (INDEPENDENT_AMBULATORY_CARE_PROVIDER_SITE_OTHER): Payer: Self-pay | Admitting: Obstetrics and Gynecology

## 2020-01-25 ENCOUNTER — Other Ambulatory Visit: Payer: Self-pay

## 2020-01-25 ENCOUNTER — Encounter: Payer: Self-pay | Admitting: Obstetrics and Gynecology

## 2020-01-25 VITALS — BP 94/63 | HR 99 | Wt 150.6 lb

## 2020-01-25 DIAGNOSIS — Z3A26 26 weeks gestation of pregnancy: Secondary | ICD-10-CM

## 2020-01-25 DIAGNOSIS — O0990 Supervision of high risk pregnancy, unspecified, unspecified trimester: Secondary | ICD-10-CM

## 2020-01-25 DIAGNOSIS — O30042 Twin pregnancy, dichorionic/diamniotic, second trimester: Secondary | ICD-10-CM

## 2020-01-25 DIAGNOSIS — O365920 Maternal care for other known or suspected poor fetal growth, second trimester, not applicable or unspecified: Secondary | ICD-10-CM | POA: Insufficient documentation

## 2020-01-25 DIAGNOSIS — O30043 Twin pregnancy, dichorionic/diamniotic, third trimester: Secondary | ICD-10-CM | POA: Insufficient documentation

## 2020-01-25 MED ORDER — FOLIC ACID 1 MG PO TABS: 1.0000 mg | ORAL_TABLET | Freq: Every day | ORAL | 10 refills | Status: DC

## 2020-01-26 NOTE — Progress Notes (Signed)
Prenatal Visit Note Date: 01/25/2020 Clinic: [facility]  CC: transfer of care from [facility] due to Di-Di twins  Subjective:  [place] is a 29 y.o. G3P2002 at [redacted]w[redacted]d being seen today for ongoing prenatal care.  She is currently monitored for the following issues for this high-risk pregnancy and has [redacted] weeks gestation of pregnancy; Supervision of high risk pregnancy, antepartum; Poor fetal growth affecting management of mother in second trimester; and Dichorionic diamniotic twin pregnancy in second trimester on their problem list.  Patient reports no complaints.   Contractions: Not present. Vag. Bleeding: None.  Movement: Present. Denies leaking of fluid.   The following portions of the patient's history were reviewed and updated as appropriate: allergies, current medications, past family history, past medical history, past social history, past surgical history and problem list. Problem list updated.  Objective:   Vitals:   01/25/20 1446  BP: 94/63  Pulse: 99  Weight: 150 lb 9.6 oz (68.3 kg)    Fetal Status: Fetal Heart Rate (bpm): 125/140   Movement: Present     General:  Alert, oriented and cooperative. Patient is in no acute distress.  Skin: Skin is warm and dry. No rash noted.   Cardiovascular: Normal heart rate noted  Respiratory: Normal respiratory effort, no problems with respiration noted  Abdomen: Soft, gravid, appropriate for gestational age. Pain/Pressure: Absent     Pelvic:  Cervical exam deferred        Extremities: Normal range of motion.  Edema: None  Mental Status: Normal mood and affect. Normal behavior. Normal judgment and thought content.   Urinalysis:      Assessment and Plan:  Pregnancy: G3P2002 at [redacted]w[redacted]d  1. Supervision of high risk pregnancy, antepartum [facility] records reviewed Request made for stat mfm detailed ultrasound.  Recommended extra mg of folic acid Follow up HD hiv, pap and hepC results.  O  pos/rubella immune/rpr neg/hepB surf Ag neg/hemoglobinopathy neg/GC-CT neg/Ucx neg/CF neg/UDS neg/CBC normal, Hgb 11  - [place] MFM OB DETAIL +14 WK; Future - [place] MFM OB DETAIL ADDL GEST +14 WK; Future  2. Dichorionic diamniotic twin pregnancy in second trimester See above.  - [place] MFM OB DETAIL +14 WK; Future - [place] MFM OB DETAIL ADDL GEST +14 WK; Future  3. Poor fetal growth affecting management of mother in second trimester, single or unspecified fetus Request made for asap formal mfm anatomy and growth u/s. [facility] precautions given  4. [redacted] weeks gestation of pregnancy  Preterm labor symptoms and general obstetric precautions including but not limited to vaginal bleeding, contractions, leaking of fluid and fetal movement were reviewed in detail with the patient. Please refer to After Visit Summary for other counseling recommendations.  Return in about 1 week (around 02/01/2020) for high risk, in person.   [place] Bing, MD

## 2020-01-30 ENCOUNTER — Other Ambulatory Visit: Payer: Self-pay

## 2020-01-30 ENCOUNTER — Ambulatory Visit: Payer: Medicaid Other | Attending: Obstetrics and Gynecology

## 2020-01-30 ENCOUNTER — Ambulatory Visit: Payer: Self-pay | Admitting: *Deleted

## 2020-01-30 DIAGNOSIS — Z3A27 27 weeks gestation of pregnancy: Secondary | ICD-10-CM

## 2020-01-30 DIAGNOSIS — O0992 Supervision of high risk pregnancy, unspecified, second trimester: Secondary | ICD-10-CM | POA: Diagnosis not present

## 2020-01-30 DIAGNOSIS — O322XX0 Maternal care for transverse and oblique lie, not applicable or unspecified: Secondary | ICD-10-CM

## 2020-01-30 DIAGNOSIS — O30042 Twin pregnancy, dichorionic/diamniotic, second trimester: Secondary | ICD-10-CM | POA: Diagnosis not present

## 2020-01-30 DIAGNOSIS — O0990 Supervision of high risk pregnancy, unspecified, unspecified trimester: Secondary | ICD-10-CM | POA: Insufficient documentation

## 2020-01-30 DIAGNOSIS — O0932 Supervision of pregnancy with insufficient antenatal care, second trimester: Secondary | ICD-10-CM

## 2020-01-30 DIAGNOSIS — Z364 Encounter for antenatal screening for fetal growth retardation: Secondary | ICD-10-CM

## 2020-01-31 ENCOUNTER — Other Ambulatory Visit: Payer: Self-pay | Admitting: *Deleted

## 2020-01-31 DIAGNOSIS — O30043 Twin pregnancy, dichorionic/diamniotic, third trimester: Secondary | ICD-10-CM

## 2020-02-02 ENCOUNTER — Ambulatory Visit (INDEPENDENT_AMBULATORY_CARE_PROVIDER_SITE_OTHER): Payer: Self-pay | Admitting: Obstetrics and Gynecology

## 2020-02-02 ENCOUNTER — Other Ambulatory Visit: Payer: Self-pay

## 2020-02-02 VITALS — BP 95/62 | HR 99 | Wt 148.0 lb

## 2020-02-02 DIAGNOSIS — Z3A27 27 weeks gestation of pregnancy: Secondary | ICD-10-CM

## 2020-02-02 DIAGNOSIS — O0990 Supervision of high risk pregnancy, unspecified, unspecified trimester: Secondary | ICD-10-CM

## 2020-02-02 DIAGNOSIS — O30042 Twin pregnancy, dichorionic/diamniotic, second trimester: Secondary | ICD-10-CM

## 2020-02-02 MED ORDER — ASPIRIN 81 MG PO CHEW
81.0000 mg | CHEWABLE_TABLET | Freq: Every day | ORAL | Status: DC
Start: 2020-02-02 — End: 2020-03-26

## 2020-02-02 NOTE — Patient Instructions (Signed)
Third Trimester of Pregnancy  The third trimester is from week 28 through week 40 (months 7 through 9). This trimester is when your unborn baby (fetus) is growing very fast. At the end of the ninth month, the unborn baby is about 20 inches in length. It weighs about 6-10 pounds. Follow these instructions at home: Medicines  Take over-the-counter and prescription medicines only as told by your doctor. Some medicines are safe and some medicines are not safe during pregnancy.  Take a prenatal vitamin that contains at least 600 micrograms (mcg) of folic acid.  If you have trouble pooping (constipation), take medicine that will make your stool soft (stool softener) if your doctor approves. Eating and drinking   Eat regular, healthy meals.  Avoid raw meat and uncooked cheese.  If you get low calcium from the food you eat, talk to your doctor about taking a daily calcium supplement.  Eat four or five small meals rather than three large meals a day.  Avoid foods that are high in fat and sugars, such as fried and sweet foods.  To prevent constipation: ? Eat foods that are high in fiber, like fresh fruits and vegetables, whole grains, and beans. ? Drink enough fluids to keep your pee (urine) clear or pale yellow. Activity  Exercise only as told by your doctor. Stop exercising if you start to have cramps.  Avoid heavy lifting, wear low heels, and sit up straight.  Do not exercise if it is too hot, too humid, or if you are in a place of great height (high altitude).  You may continue to have sex unless your doctor tells you not to. Relieving pain and discomfort  Wear a good support bra if your breasts are tender.  Take frequent breaks and rest with your legs raised if you have leg cramps or low back pain.  Take warm water baths (sitz baths) to soothe pain or discomfort caused by hemorrhoids. Use hemorrhoid cream if your doctor approves.  If you develop puffy, bulging veins (varicose  veins) in your legs: ? Wear support hose or compression stockings as told by your doctor. ? Raise (elevate) your feet for 15 minutes, 3-4 times a day. ? Limit salt in your food. Safety  Wear your seat belt when driving.  Make a list of emergency phone numbers, including numbers for family, friends, the hospital, and police and fire departments. Preparing for your baby's arrival To prepare for the arrival of your baby:  Take prenatal classes.  Practice driving to the hospital.  Visit the hospital and tour the maternity area.  Talk to your work about taking leave once the baby comes.  Pack your hospital bag.  Prepare the baby's room.  Go to your doctor visits.  Buy a rear-facing car seat. Learn how to install it in your car. General instructions  Do not use hot tubs, steam rooms, or saunas.  Do not use any products that contain nicotine or tobacco, such as cigarettes and e-cigarettes. If you need help quitting, ask your doctor.  Do not drink alcohol.  Do not douche or use tampons or scented sanitary pads.  Do not cross your legs for long periods of time.  Do not travel for long distances unless you must. Only do so if your doctor says it is okay.  Visit your dentist if you have not gone during your pregnancy. Use a soft toothbrush to brush your teeth. Be gentle when you floss.  Avoid cat litter boxes and soil   used by cats. These carry germs that can cause birth defects in the baby and can cause a loss of your baby (miscarriage) or stillbirth.  Keep all your prenatal visits as told by your doctor. This is important. Contact a doctor if:  You are not sure if you are in labor or if your water has broken.  You are dizzy.  You have mild cramps or pressure in your lower belly.  You have a nagging pain in your belly area.  You continue to feel sick to your stomach, you throw up, or you have watery poop.  You have bad smelling fluid coming from your vagina.  You have  pain when you pee. Get help right away if:  You have a fever.  You are leaking fluid from your vagina.  You are spotting or bleeding from your vagina.  You have severe belly cramps or pain.  You lose or gain weight quickly.  You have trouble catching your breath and have chest pain.  You notice sudden or extreme puffiness (swelling) of your face, hands, ankles, feet, or legs.  You have not felt the baby move in over an hour.  You have severe headaches that do not go away with medicine.  You have trouble seeing.  You are leaking, or you are having a gush of fluid, from your vagina before you are 37 weeks.  You have regular belly spasms (contractions) before you are 37 weeks. Summary  The third trimester is from week 28 through week 40 (months 7 through 9). This time is when your unborn baby is growing very fast.  Follow your doctor's advice about medicine, food, and activity.  Get ready for the arrival of your baby by taking prenatal classes, getting all the baby items ready, preparing the baby's room, and visiting your doctor to be checked.  Get help right away if you are bleeding from your vagina, or you have chest pain and trouble catching your breath, or if you have not felt your baby move in over an hour. This information is not intended to replace advice given to you by your health care provider. Make sure you discuss any questions you have with your health care provider. Document Revised: 09/16/2018 Document Reviewed: 07/01/2016 Elsevier Patient Education  2020 [employer]. [employer] ([place]).Twin and Triplet Pregnancy. [place]: [employer] and [employer] ([place]); 2019.">  Multiple Pregnancy Multiple pregnancy means that a woman is carrying more than one baby at a time. She may be pregnant with twins, triplets, or more. The majority of multiple pregnancies are twins. Naturally conceiving triplets or more (higher-order multiples) is  rare. Multiple pregnancies are riskier than single pregnancies. A woman with a multiple pregnancy is more likely to have certain problems during her pregnancy. How does a multiple pregnancy happen? A multiple pregnancy happens when:  The woman's body releases more than one egg at a time, and then each egg gets fertilized by a different sperm. ? This is the most common type of multiple pregnancy. ? Twins or other multiples produced this way are called fraternal. They are no more alike than non-multiple siblings are.  One sperm fertilizes one egg, which then divides into more than one embryo. ? Twins or other multiples produced this way are called identical. Identical multiples are always the same gender, and they look very much alike. Who is most likely to have a multiple pregnancy? A multiple pregnancy is more likely to develop in women who:  Have  had fertility treatment, especially if the treatment included fertility medicines.  Are older than 31 years of age.  Have already had four or more children.  Have a family history of multiple pregnancy. How is a multiple pregnancy diagnosed? A multiple pregnancy may be diagnosed based on:  Symptoms such as: ? Rapid weight gain in the first 3 months of pregnancy (first trimester). ? More severe nausea and breast tenderness than what is typical of a single pregnancy. ? A larger uterus than what is normal for the stage of the pregnancy.  Blood tests that detect a higher-than-normal level of human chorionic gonadotropin (hCG). This is a hormone that your body produces in early pregnancy.  An ultrasound exam. This is used to confirm that you are carrying multiples. What risks come with multiple pregnancy? A multiple pregnancy puts you at a higher risk for certain problems during or after your pregnancy. These include:  Delivering your babies before your due date (preterm birth). A full-term pregnancy lasts for at least 37 weeks. ? Babies born  before 37 weeks may have a higher risk for breathing problems, feeding difficulties, cerebral palsy, and learning disabilities.  Diabetes.  Preeclampsia. This is a serious condition that causes high blood pressure and headaches during pregnancy.  Too much blood loss after childbirth (postpartum hemorrhage).  Postpartum depression.  Low birth weight of the babies. How will having a multiple pregnancy affect my care? Your health care team will monitor you more closely. You may need more frequent prenatal visits. This will ensure that you are healthy and that your babies are growing normally. Follow these instructions at home: Eating and drinking  Increase your nutrition. ? Follow your health care provider's recommendations for weight gain. You may need to gain a little extra weight when you are pregnant with multiples. ? Eat healthy snacks often throughout the day. This will add calories and reduce nausea.  Drink enough fluid to keep your urine pale yellow.  Take prenatal vitamins. Ask your health care provider what vitamins are right for you. Activity Limit your activities by 20-24 weeks of pregnancy.  Rest often.  Avoid activities, exercise, and work that take a lot of effort.  Ask your health care provider when you should stop having sex. General instructions  Do not use any products that contain nicotine or tobacco, such as cigarettes, e-cigarettes, and chewing tobacco. If you need help quitting, ask your health care provider.  Do not drink alcohol or use illegal drugs.  Take over-the-counter and prescription medicines only as told by your health care provider.  Arrange for extra help around the house.  Keep all follow-up visits and all prenatal visits as told by your health care provider. This is important. Where to find more information  [employer]: www.acog.org Contact a health care provider if:  You have dizziness.  You have  nausea, vomiting, or diarrhea that does not go away.  You have depression or other emotions that are interfering with your normal activities.  You have a fever.  You have pain with urination.  You have a bad-smelling vaginal discharge.  You notice increased swelling in your face, hands, legs, or ankles. Get help right away if:  You have fluid leaking from your vagina.  You have bleeding from your vagina.  You have pelvic cramps, pelvic pressure, or nagging pain in your abdomen or lower back.  You are having regular contractions.  You have a severe headache, with or without changes in  how you see.  You have chest pain or shortness of breath.  You notice that your babies move less often, or do not move at all. Summary  Having a multiple pregnancy means that a woman is carrying more than one baby at a time.  A multiple pregnancy puts you at a higher risk for delivering your babies before your due date, having diabetes, preeclampsia, too much blood loss after childbirth, or low birth weight of the babies.  Your health care provider will monitor you more closely during your pregnancy.  You may need to make some lifestyle changes during pregnancy. This includes eating more, limiting your activities after 20-24 weeks of pregnancy, and arranging for extra help around the house.  Follow up with your health care provider as instructed if you experience any complications. This information is not intended to replace advice given to you by your health care provider. Make sure you discuss any questions you have with your health care provider. Document Revised: 01/17/2019 Document Reviewed: 01/17/2019 Elsevier Patient Education  2020 [employer].

## 2020-02-02 NOTE — Progress Notes (Signed)
   PRENATAL VISIT NOTE  Subjective:  Emily Herman is a 29 y.o. G3P2002 at [redacted]w[redacted]d being seen today for ongoing prenatal care.  She is currently monitored for the following issues for this high-risk pregnancy and has [redacted] weeks gestation of pregnancy; Supervision of high risk pregnancy, antepartum; Dichorionic diamniotic twin pregnancy in second trimester; and [redacted] weeks gestation of pregnancy on their problem list.  Patient doing well with no acute concerns today. She reports no complaints.  Contractions: Not present. Vag. Bleeding: None.  Movement: Present. Denies leaking of fluid.   Pt seen.  She wants some form of "natural" birth control preferably with no hormones.  Discussed paraguard and lilletta.  She was interested in BTL but she thought it was reversible.  Pt advised it is permanent.  The following portions of the patient's history were reviewed and updated as appropriate: allergies, current medications, past family history, past medical history, past social history, past surgical history and problem list. Problem list updated.  Objective:   Vitals:   02/02/20 1351  BP: 95/62  Pulse: 99  Weight: 148 lb (67.1 kg)    Fetal Status: Fetal Heart Rate (bpm): 141/144 Fundal Height: 32 cm Movement: Present     General:  Alert, oriented and cooperative. Patient is in no acute distress.  Skin: Skin is warm and dry. No rash noted.   Cardiovascular: Normal heart rate noted  Respiratory: Normal respiratory effort, no problems with respiration noted  Abdomen: Soft, gravid, appropriate for gestational age.  Pain/Pressure: Absent     Pelvic: Cervical exam deferred        Extremities: Normal range of motion.  Edema: None  Mental Status:  Normal mood and affect. Normal behavior. Normal judgment and thought content.   Assessment and Plan:  Pregnancy: G3P2002 at [redacted]w[redacted]d  1. [redacted] weeks gestation of pregnancy   2. Dichorionic diamniotic twin pregnancy in second trimester  - aspirin  chewable tablet 81 mg  3. Supervision of high risk pregnancy, antepartum Pt has had 2 1 hour GTT this pregnancy, both normal, health department records reviewed Requested [facility] - aspirin chewable tablet 81 mg  Preterm labor symptoms and general obstetric precautions including but not limited to vaginal bleeding, contractions, leaking of fluid and fetal movement were reviewed in detail with the patient. After pt left it was revealed she has no medicaid.  IUD would need to be placed through health department. Please refer to After Visit Summary for other counseling recommendations.   Return in about 2 weeks (around 02/16/2020) for [facility], in person.   Mariel Aloe, MD

## 2020-02-22 ENCOUNTER — Other Ambulatory Visit: Payer: Self-pay

## 2020-02-22 ENCOUNTER — Ambulatory Visit (INDEPENDENT_AMBULATORY_CARE_PROVIDER_SITE_OTHER): Payer: Self-pay | Admitting: Obstetrics and Gynecology

## 2020-02-22 VITALS — BP 96/66 | HR 91 | Wt 153.5 lb

## 2020-02-22 DIAGNOSIS — O0990 Supervision of high risk pregnancy, unspecified, unspecified trimester: Secondary | ICD-10-CM

## 2020-02-22 DIAGNOSIS — O30042 Twin pregnancy, dichorionic/diamniotic, second trimester: Secondary | ICD-10-CM

## 2020-02-22 NOTE — Progress Notes (Signed)
   PRENATAL VISIT NOTE  Subjective:  Emily Herman is a 29 y.o. G3P2002 at [redacted]w[redacted]d being seen today for ongoing prenatal care.  She is currently monitored for the following issues for this high-risk pregnancy and has Supervision of high risk pregnancy, antepartum and Dichorionic diamniotic twin pregnancy in second trimester on their problem list.  Patient doing well with no acute concerns today. She reports no complaints.  Contractions: Not present. Vag. Bleeding: None.  Movement: Present. Denies leaking of fluid.   Long discussion with patient regarding birth control.  Initially she and her spouse were considering vasectomy, but she did not realize the procedure was considered permanent.  She is also interested in the paraguard IUD after delivery.  Pt is unsure about route of delivery.  Explained that many of our doctors here will deliver a breech fetus.  Pt is unsure if she wants that and may opt for scheduled primary c section.  She is still thinking about her options.  The following portions of the patient's history were reviewed and updated as appropriate: allergies, current medications, past family history, past medical history, past social history, past surgical history and problem list. Problem list updated.  Objective:   Vitals:   02/22/20 1421  BP: 96/66  Pulse: 91  Weight: 153 lb 8 oz (69.6 kg)    Fetal Status: Fetal Heart Rate (bpm): 132/138 Fundal Height: 35 cm Movement: Present     General:  Alert, oriented and cooperative. Patient is in no acute distress.  Skin: Skin is warm and dry. No rash noted.   Cardiovascular: Normal heart rate noted  Respiratory: Normal respiratory effort, no problems with respiration noted  Abdomen: Soft, gravid, appropriate for gestational age.  Pain/Pressure: Absent     Pelvic: Cervical exam deferred        Extremities: Normal range of motion.  Edema: None  Mental Status:  Normal mood and affect. Normal behavior. Normal judgment and  thought content.   Assessment and Plan:  Pregnancy: G3P2002 at [redacted]w[redacted]d  1. Supervision of high risk pregnancy, antepartum   2. Dichorionic diamniotic twin pregnancy in second trimester Pt has growth scan scheduled on 9/21  Preterm labor symptoms and general obstetric precautions including but not limited to vaginal bleeding, contractions, leaking of fluid and fetal movement were reviewed in detail with the patient.  Please refer to After Visit Summary for other counseling recommendations.   Return in about 2 weeks (around 03/07/2020) for [facility], in person.   Mariel Aloe, MD

## 2020-02-22 NOTE — Patient Instructions (Signed)
Cesarean Delivery Cesarean birth, or cesarean delivery, is the surgical delivery of a baby through an incision in the abdomen and the uterus. This may be referred to as a C-section. This procedure may be scheduled ahead of time, or it may be done in an emergency situation. Tell a health care provider about:  Any allergies you have.  All medicines you are taking, including vitamins, herbs, eye drops, creams, and over-the-counter medicines.  Any problems you or family members have had with anesthetic medicines.  Any blood disorders you have.  Any surgeries you have had.  Any medical conditions you have.  Whether you or any members of your family have a history of deep vein thrombosis (DVT) or pulmonary embolism (PE). What are the risks? Generally, this is a safe procedure. However, problems may occur, including:  Infection.  Bleeding.  Allergic reactions to medicines.  Damage to other structures or organs.  Blood clots.  Injury to your baby. What happens before the procedure? General instructions  Follow instructions from your health care provider about eating or drinking restrictions.  If you know that you are going to have a cesarean delivery, do not shave your pubic area. Shaving before the procedure may increase your risk of infection.  Plan to have someone take you home from the hospital.  Ask your health care provider what steps will be taken to prevent infection. These may include: ? Removing hair at the surgery site. ? Washing skin with a germ-killing soap. ? Taking antibiotic medicine.  Depending on the reason for your cesarean delivery, you may have a physical exam or additional testing, such as an ultrasound.  You may have your blood or urine tested. Questions for your health care provider  Ask your health care provider about: ? Changing or stopping your regular medicines. This is especially important if you are taking diabetes medicines or blood  thinners. ? Your pain management plan. This is especially important if you plan to breastfeed your baby. ? How long you will be in the hospital after the procedure. ? Any concerns you may have about receiving blood products, if you need them during the procedure. ? Cord blood banking, if you plan to collect your baby's umbilical cord blood.  You may also want to ask your health care provider: ? Whether you will be able to hold or breastfeed your baby while you are still in the operating room. ? Whether your baby can stay with you immediately after the procedure and during your recovery. ? Whether a family member or a person of your choice can go with you into the operating room and stay with you during the procedure, immediately after the procedure, and during your recovery. What happens during the procedure?   An IV will be inserted into one of your veins.  Fluid and medicines, such as antibiotics, will be given before the surgery.  Fetal monitors will be placed on your abdomen to check your baby's heart rate.  You may be given a special warming gown to wear to keep your temperature stable.  A catheter may be inserted into your bladder through your urethra. This drains your urine during the procedure.  You may be given one or more of the following: ? A medicine to numb the area (local anesthetic). ? A medicine to make you fall asleep (general anesthetic). ? A medicine (regional anesthetic) that is injected into your back or through a small thin tube placed in your back (spinal anesthetic or epidural anesthetic).   This numbs everything below the injection site and allows you to stay awake during your procedure. If this makes you feel nauseous, tell your health care provider. Medicines will be available to help reduce any nausea you may feel.  An incision will be made in your abdomen, and then in your uterus.  If you are awake during your procedure, you may feel tugging and pulling in  your abdomen, but you should not feel pain. If you feel pain, tell your health care provider immediately.  Your baby will be removed from your uterus. You may feel more pressure or pushing while this happens.  Immediately after birth, your baby will be dried and kept warm. You may be able to hold and breastfeed your baby.  The umbilical cord may be clamped and cut during this time. This usually occurs after waiting a period of 1-2 minutes after delivery.  Your placenta will be removed from your uterus.  Your incisions will be closed with stitches (sutures). Staples, skin glue, or adhesive strips may also be applied to the incision in your abdomen.  Bandages (dressings) may be placed over the incision in your abdomen. The procedure may vary among health care providers and hospitals. What happens after the procedure?  Your blood pressure, heart rate, breathing rate, and blood oxygen level will be monitored until you are discharged from the hospital.  You may continue to receive fluids and medicines through an IV.  You will have some pain. Medicines will be available to help control your pain.  To help prevent blood clots: ? You may be given medicines. ? You may have to wear compression stockings or devices. ? You will be encouraged to walk around when you are able.  Hospital staff will encourage and support bonding with your baby. Your hospital may have you and your baby to stay in the same room (rooming in) during your hospital stay to encourage successful bonding and breastfeeding.  You may be encouraged to cough and breathe deeply often. This helps to prevent lung problems.  If you have a catheter draining your urine, it will be removed as soon as possible after your procedure. Summary  Cesarean birth, or cesarean delivery, is the surgical delivery of a baby through an incision in the abdomen and the uterus.  Follow instructions from your health care provider about eating or  drinking restrictions before the procedure.  You will have some pain after the procedure. Medicines will be available to help control your pain.  Hospital staff will encourage and support bonding with your baby after the procedure. Your hospital may have you and your baby to stay in the same room (rooming in) during your hospital stay to encourage successful bonding and breastfeeding. This information is not intended to replace advice given to you by your health care provider. Make sure you discuss any questions you have with your health care provider. Document Revised: 11/30/2017 Document Reviewed: 11/30/2017 Elsevier Patient Education  2020 [employer]. Third Trimester of Pregnancy  The third trimester is from week 28 through week 40 (months 7 through 9). This trimester is when your unborn baby (fetus) is growing very fast. At the end of the ninth month, the unborn baby is about 20 inches in length. It weighs about 6-10 pounds. Follow these instructions at home: Medicines  Take over-the-counter and prescription medicines only as told by your doctor. Some medicines are safe and some medicines are not safe during pregnancy.  Take a prenatal vitamin that contains at  contains at least 600 micrograms (mcg) of folic acid.  If you have trouble pooping (constipation), take medicine that will make your stool soft (stool softener) if your doctor approves. Eating and drinking   Eat regular, healthy meals.  Avoid raw meat and uncooked cheese.  If you get low calcium from the food you eat, talk to your doctor about taking a daily calcium supplement.  Eat four or five small meals rather than three large meals a day.  Avoid foods that are high in fat and sugars, such as fried and sweet foods.  To prevent constipation: ? Eat foods that are high in fiber, like fresh fruits and vegetables, whole grains, and beans. ? Drink enough fluids to keep your pee (urine) clear or pale yellow. Activity  Exercise  only as told by your doctor. Stop exercising if you start to have cramps.  Avoid heavy lifting, wear low heels, and sit up straight.  Do not exercise if it is too hot, too humid, or if you are in a place of great height (high altitude).  You may continue to have sex unless your doctor tells you not to. Relieving pain and discomfort  Wear a good support bra if your breasts are tender.  Take frequent breaks and rest with your legs raised if you have leg cramps or low back pain.  Take warm water baths (sitz baths) to soothe pain or discomfort caused by hemorrhoids. Use hemorrhoid cream if your doctor approves.  If you develop puffy, bulging veins (varicose veins) in your legs: ? Wear support hose or compression stockings as told by your doctor. ? Raise (elevate) your feet for 15 minutes, 3-4 times a day. ? Limit salt in your food. Safety  Wear your seat belt when driving.  Make a list of emergency phone numbers, including numbers for family, friends, the hospital, and police and fire departments. Preparing for your baby's arrival To prepare for the arrival of your baby:  Take prenatal classes.  Practice driving to the hospital.  Visit the hospital and tour the maternity area.  Talk to your work about taking leave once the baby comes.  Pack your hospital bag.  Prepare the baby's room.  Go to your doctor visits.  Buy a rear-facing car seat. Learn how to install it in your car. General instructions  Do not use hot tubs, steam rooms, or saunas.  Do not use any products that contain nicotine or tobacco, such as cigarettes and e-cigarettes. If you need help quitting, ask your doctor.  Do not drink alcohol.  Do not douche or use tampons or scented sanitary pads.  Do not cross your legs for long periods of time.  Do not travel for long distances unless you must. Only do so if your doctor says it is okay.  Visit your dentist if you have not gone during your pregnancy. Use  a soft toothbrush to brush your teeth. Be gentle when you floss.  Avoid cat litter boxes and soil used by cats. These carry germs that can cause birth defects in the baby and can cause a loss of your baby (miscarriage) or stillbirth.  Keep all your prenatal visits as told by your doctor. This is important. Contact a doctor if:  You are not sure if you are in labor or if your water has broken.  You are dizzy.  You have mild cramps or pressure in your lower belly.  You have a nagging pain in your belly area.  You continue   sick to your stomach, you throw up, or you have watery poop.  You have bad smelling fluid coming from your vagina.  You have pain when you pee. Get help right away if:  You have a fever.  You are leaking fluid from your vagina.  You are spotting or bleeding from your vagina.  You have severe belly cramps or pain.  You lose or gain weight quickly.  You have trouble catching your breath and have chest pain.  You notice sudden or extreme puffiness (swelling) of your face, hands, ankles, feet, or legs.  You have not felt the baby move in over an hour.  You have severe headaches that do not go away with medicine.  You have trouble seeing.  You are leaking, or you are having a gush of fluid, from your vagina before you are 37 weeks.  You have regular belly spasms (contractions) before you are 37 weeks. Summary  The third trimester is from week 28 through week 40 (months 7 through 9). This time is when your unborn baby is growing very fast.  Follow your doctor's advice about medicine, food, and activity.  Get ready for the arrival of your baby by taking prenatal classes, getting all the baby items ready, preparing the baby's room, and visiting your doctor to be checked.  Get help right away if you are bleeding from your vagina, or you have chest pain and trouble catching your breath, or if you have not felt your baby move in over an hour. This  information is not intended to replace advice given to you by your health care provider. Make sure you discuss any questions you have with your health care provider. Document Revised: 09/16/2018 Document Reviewed: 07/01/2016 Elsevier Patient Education  2020 [employer]. [employer] ([place]).Twin and Triplet Pregnancy. [place]: [employer] and [employer] ([place]); 2019.">  Multiple Pregnancy Multiple pregnancy means that a woman is carrying more than one baby at a time. She may be pregnant with twins, triplets, or more. The majority of multiple pregnancies are twins. Naturally conceiving triplets or more (higher-order multiples) is rare. Multiple pregnancies are riskier than single pregnancies. A woman with a multiple pregnancy is more likely to have certain problems during her pregnancy. How does a multiple pregnancy happen? A multiple pregnancy happens when:  The woman's body releases more than one egg at a time, and then each egg gets fertilized by a different sperm. ? This is the most common type of multiple pregnancy. ? Twins or other multiples produced this way are called fraternal. They are no more alike than non-multiple siblings are.  One sperm fertilizes one egg, which then divides into more than one embryo. ? Twins or other multiples produced this way are called identical. Identical multiples are always the same gender, and they look very much alike. Who is most likely to have a multiple pregnancy? A multiple pregnancy is more likely to develop in women who:  Have had fertility treatment, especially if the treatment included fertility medicines.  Are older than 31 years of age.  Have already had four or more children.  Have a family history of multiple pregnancy. How is a multiple pregnancy diagnosed? A multiple pregnancy may be diagnosed based on:  Symptoms such as: ? Rapid weight gain in the first 3 months of pregnancy (first  trimester). ? More severe nausea and breast tenderness than what is typical of a single pregnancy. ? A larger uterus than what is normal for  the stage of the pregnancy.  Blood tests that detect a higher-than-normal level of human chorionic gonadotropin (hCG). This is a hormone that your body produces in early pregnancy.  An ultrasound exam. This is used to confirm that you are carrying multiples. What risks come with multiple pregnancy? A multiple pregnancy puts you at a higher risk for certain problems during or after your pregnancy. These include:  Delivering your babies before your due date (preterm birth). A full-term pregnancy lasts for at least 37 weeks. ? Babies born before 37 weeks may have a higher risk for breathing problems, feeding difficulties, cerebral palsy, and learning disabilities.  Diabetes.  Preeclampsia. This is a serious condition that causes high blood pressure and headaches during pregnancy.  Too much blood loss after childbirth (postpartum hemorrhage).  Postpartum depression.  Low birth weight of the babies. How will having a multiple pregnancy affect my care? Your health care team will monitor you more closely. You may need more frequent prenatal visits. This will ensure that you are healthy and that your babies are growing normally. Follow these instructions at home: Eating and drinking  Increase your nutrition. ? Follow your health care provider's recommendations for weight gain. You may need to gain a little extra weight when you are pregnant with multiples. ? Eat healthy snacks often throughout the day. This will add calories and reduce nausea.  Drink enough fluid to keep your urine pale yellow.  Take prenatal vitamins. Ask your health care provider what vitamins are right for you. Activity Limit your activities by 20-24 weeks of pregnancy.  Rest often.  Avoid activities, exercise, and work that take a lot of effort.  Ask your health care  provider when you should stop having sex. General instructions  Do not use any products that contain nicotine or tobacco, such as cigarettes, e-cigarettes, and chewing tobacco. If you need help quitting, ask your health care provider.  Do not drink alcohol or use illegal drugs.  Take over-the-counter and prescription medicines only as told by your health care provider.  Arrange for extra help around the house.  Keep all follow-up visits and all prenatal visits as told by your health care provider. This is important. Where to find more information  [employer]: www.acog.org Contact a health care provider if:  You have dizziness.  You have nausea, vomiting, or diarrhea that does not go away.  You have depression or other emotions that are interfering with your normal activities.  You have a fever.  You have pain with urination.  You have a bad-smelling vaginal discharge.  You notice increased swelling in your face, hands, legs, or ankles. Get help right away if:  You have fluid leaking from your vagina.  You have bleeding from your vagina.  You have pelvic cramps, pelvic pressure, or nagging pain in your abdomen or lower back.  You are having regular contractions.  You have a severe headache, with or without changes in how you see.  You have chest pain or shortness of breath.  You notice that your babies move less often, or do not move at all. Summary  Having a multiple pregnancy means that a woman is carrying more than one baby at a time.  A multiple pregnancy puts you at a higher risk for delivering your babies before your due date, having diabetes, preeclampsia, too much blood loss after childbirth, or low birth weight of the babies.  Your health care provider will monitor you more closely  during your pregnancy.  You may need to make some lifestyle changes during pregnancy. This includes eating more, limiting your activities  after 20-24 weeks of pregnancy, and arranging for extra help around the house.  Follow up with your health care provider as instructed if you experience any complications. This information is not intended to replace advice given to you by your health care provider. Make sure you discuss any questions you have with your health care provider. Document Revised: 01/17/2019 Document Reviewed: 01/17/2019 Elsevier Patient Education  2020 [employer]

## 2020-02-28 ENCOUNTER — Ambulatory Visit: Payer: Medicaid Other | Attending: Obstetrics and Gynecology

## 2020-02-28 ENCOUNTER — Ambulatory Visit: Payer: Self-pay | Admitting: *Deleted

## 2020-02-28 ENCOUNTER — Other Ambulatory Visit: Payer: Self-pay

## 2020-02-28 ENCOUNTER — Other Ambulatory Visit: Payer: Self-pay | Admitting: *Deleted

## 2020-02-28 ENCOUNTER — Encounter: Payer: Self-pay | Admitting: *Deleted

## 2020-02-28 DIAGNOSIS — O322XX0 Maternal care for transverse and oblique lie, not applicable or unspecified: Secondary | ICD-10-CM | POA: Diagnosis not present

## 2020-02-28 DIAGNOSIS — O30049 Twin pregnancy, dichorionic/diamniotic, unspecified trimester: Secondary | ICD-10-CM

## 2020-02-28 DIAGNOSIS — O30043 Twin pregnancy, dichorionic/diamniotic, third trimester: Secondary | ICD-10-CM | POA: Diagnosis not present

## 2020-02-28 DIAGNOSIS — O0990 Supervision of high risk pregnancy, unspecified, unspecified trimester: Secondary | ICD-10-CM

## 2020-02-28 DIAGNOSIS — Z3A31 31 weeks gestation of pregnancy: Secondary | ICD-10-CM

## 2020-02-28 DIAGNOSIS — Z362 Encounter for other antenatal screening follow-up: Secondary | ICD-10-CM

## 2020-02-28 DIAGNOSIS — O0933 Supervision of pregnancy with insufficient antenatal care, third trimester: Secondary | ICD-10-CM | POA: Diagnosis not present

## 2020-03-06 ENCOUNTER — Ambulatory Visit: Payer: Self-pay

## 2020-03-08 ENCOUNTER — Ambulatory Visit (INDEPENDENT_AMBULATORY_CARE_PROVIDER_SITE_OTHER): Payer: Self-pay | Admitting: Obstetrics and Gynecology

## 2020-03-08 ENCOUNTER — Other Ambulatory Visit: Payer: Self-pay

## 2020-03-08 VITALS — BP 104/65 | HR 98 | Wt 155.7 lb

## 2020-03-08 DIAGNOSIS — O0990 Supervision of high risk pregnancy, unspecified, unspecified trimester: Secondary | ICD-10-CM

## 2020-03-08 DIAGNOSIS — O30042 Twin pregnancy, dichorionic/diamniotic, second trimester: Secondary | ICD-10-CM

## 2020-03-08 NOTE — Progress Notes (Signed)
   PRENATAL VISIT NOTE  Subjective:  Emily Herman is a 29 y.o. G3P2002 at [redacted]w[redacted]d being seen today for ongoing prenatal care.  She is currently monitored for the following issues for this high-risk pregnancy and has Supervision of high risk pregnancy, antepartum and Dichorionic diamniotic twin pregnancy in second trimester on their problem list.  Patient doing well with no acute concerns today. She reports no complaints.  Contractions: Irritability. Vag. Bleeding: None.  Movement: Present. Denies leaking of fluid.   The following portions of the patient's history were reviewed and updated as appropriate: allergies, current medications, past family history, past medical history, past social history, past surgical history and problem list. Problem list updated.  Objective:   Vitals:   03/08/20 1329  BP: 104/65  Pulse: 98  Weight: 155 lb 11.2 oz (70.6 kg)    Fetal Status: Fetal Heart Rate (bpm): 146/142 Fundal Height: 36 cm Movement: Present     General:  Alert, oriented and cooperative. Patient is in no acute distress.  Skin: Skin is warm and dry. No rash noted.   Cardiovascular: Normal heart rate noted  Respiratory: Normal respiratory effort, no problems with respiration noted  Abdomen: Soft, gravid, appropriate for gestational age.  Pain/Pressure: Absent     Pelvic: Cervical exam deferred        Extremities: Normal range of motion.  Edema: None  Mental Status:  Normal mood and affect. Normal behavior. Normal judgment and thought content.   Assessment and Plan:  Pregnancy: G3P2002 at [redacted]w[redacted]d  1. Dichorionic diamniotic twin pregnancy in second trimester No growth discordance  2. Supervision of high risk pregnancy, antepartum Repeat growth scan and BPP 03/28/2020  Preterm labor symptoms and general obstetric precautions including but not limited to vaginal bleeding, contractions, leaking of fluid and fetal movement were reviewed in detail with the patient.  Please refer  to After Visit Summary for other counseling recommendations.   Return in about 2 weeks (around 03/22/2020) for [facility], in person.   Mariel Aloe, MD

## 2020-03-08 NOTE — Patient Instructions (Signed)
[employer]eline [employer] ([place]).Twin and Triplet Pregnancy. [place]: National Institute for Health and Care Excellence ([place]); 2019.">  Multiple Pregnancy Multiple pregnancy means that a woman is carrying more than one baby at a time. She may be pregnant with twins, triplets, or more. The majority of multiple pregnancies are twins. Naturally conceiving triplets or more (higher-order multiples) is rare. Multiple pregnancies are riskier than single pregnancies. A woman with a multiple pregnancy is more likely to have certain problems during her pregnancy. How does a multiple pregnancy happen? A multiple pregnancy happens when:  The woman's body releases more than one egg at a time, and then each egg gets fertilized by a different sperm. ? This is the most common type of multiple pregnancy. ? Twins or other multiples produced this way are called fraternal. They are no more alike than non-multiple siblings are.  One sperm fertilizes one egg, which then divides into more than one embryo. ? Twins or other multiples produced this way are called identical. Identical multiples are always the same gender, and they look very much alike. Who is most likely to have a multiple pregnancy? A multiple pregnancy is more likely to develop in women who:  Have had fertility treatment, especially if the treatment included fertility medicines.  Are older than 31 years of age.  Have already had four or more children.  Have a family history of multiple pregnancy. How is a multiple pregnancy diagnosed? A multiple pregnancy may be diagnosed based on:  Symptoms such as: ? Rapid weight gain in the first 3 months of pregnancy (first trimester). ? More severe nausea and breast tenderness than what is typical of a single pregnancy. ? A larger uterus than what is normal for the stage of the pregnancy.  Blood tests that detect a higher-than-normal level of human chorionic gonadotropin (hCG). This is a hormone that your  body produces in early pregnancy.  An ultrasound exam. This is used to confirm that you are carrying multiples. What risks come with multiple pregnancy? A multiple pregnancy puts you at a higher risk for certain problems during or after your pregnancy. These include:  Delivering your babies before your due date (preterm birth). A full-term pregnancy lasts for at least 37 weeks. ? Babies born before 37 weeks may have a higher risk for breathing problems, feeding difficulties, cerebral palsy, and learning disabilities.  Diabetes.  Preeclampsia. This is a serious condition that causes high blood pressure and headaches during pregnancy.  Too much blood loss after childbirth (postpartum hemorrhage).  Postpartum depression.  Low birth weight of the babies. How will having a multiple pregnancy affect my care? Your health care team will monitor you more closely. You may need more frequent prenatal visits. This will ensure that you are healthy and that your babies are growing normally. Follow these instructions at home: Eating and drinking  Increase your nutrition. ? Follow your health care provider's recommendations for weight gain. You may need to gain a little extra weight when you are pregnant with multiples. ? Eat healthy snacks often throughout the day. This will add calories and reduce nausea.  Drink enough fluid to keep your urine pale yellow.  Take prenatal vitamins. Ask your health care provider what vitamins are right for you. Activity Limit your activities by 20-24 weeks of pregnancy.  Rest often.  Avoid activities, exercise, and work that take a lot of effort.  Ask your health care provider when you should stop having sex. General instructions  Do not use any   products that contain nicotine or tobacco, such as cigarettes, e-cigarettes, and chewing tobacco. If you need help quitting, ask your health care provider.  Do not drink alcohol or use illegal drugs.  Take  over-the-counter and prescription medicines only as told by your health care provider.  Arrange for extra help around the house.  Keep all follow-up visits and all prenatal visits as told by your health care provider. This is important. Where to find more information  American College of Obstetricians and Gynecology: www.acog.org Contact a health care provider if:  You have dizziness.  You have nausea, vomiting, or diarrhea that does not go away.  You have depression or other emotions that are interfering with your normal activities.  You have a fever.  You have pain with urination.  You have a bad-smelling vaginal discharge.  You notice increased swelling in your face, hands, legs, or ankles. Get help right away if:  You have fluid leaking from your vagina.  You have bleeding from your vagina.  You have pelvic cramps, pelvic pressure, or nagging pain in your abdomen or lower back.  You are having regular contractions.  You have a severe headache, with or without changes in how you see.  You have chest pain or shortness of breath.  You notice that your babies move less often, or do not move at all. Summary  Having a multiple pregnancy means that a woman is carrying more than one baby at a time.  A multiple pregnancy puts you at a higher risk for delivering your babies before your due date, having diabetes, preeclampsia, too much blood loss after childbirth, or low birth weight of the babies.  Your health care provider will monitor you more closely during your pregnancy.  You may need to make some lifestyle changes during pregnancy. This includes eating more, limiting your activities after 20-24 weeks of pregnancy, and arranging for extra help around the house.  Follow up with your health care provider as instructed if you experience any complications. This information is not intended to replace advice given to you by your health care provider. Make sure you discuss  any questions you have with your health care provider. Document Revised: 01/17/2019 Document Reviewed: 01/17/2019 Elsevier Patient Education  2020 Elsevier Inc.  

## 2020-03-26 ENCOUNTER — Ambulatory Visit (INDEPENDENT_AMBULATORY_CARE_PROVIDER_SITE_OTHER): Payer: Medicaid Other | Admitting: Obstetrics and Gynecology

## 2020-03-26 ENCOUNTER — Other Ambulatory Visit (HOSPITAL_COMMUNITY)
Admission: RE | Admit: 2020-03-26 | Discharge: 2020-03-26 | Disposition: A | Discharge status: Home / Self Care | Payer: Self-pay | Source: Ambulatory Visit | Attending: Obstetrics and Gynecology | Admitting: Obstetrics and Gynecology

## 2020-03-26 ENCOUNTER — Other Ambulatory Visit: Payer: Self-pay

## 2020-03-26 VITALS — BP 104/68 | HR 97 | Wt 158.3 lb

## 2020-03-26 DIAGNOSIS — O0990 Supervision of high risk pregnancy, unspecified, unspecified trimester: Secondary | ICD-10-CM | POA: Insufficient documentation

## 2020-03-26 DIAGNOSIS — Z23 Encounter for immunization: Secondary | ICD-10-CM

## 2020-03-26 DIAGNOSIS — O30043 Twin pregnancy, dichorionic/diamniotic, third trimester: Secondary | ICD-10-CM

## 2020-03-26 NOTE — Progress Notes (Signed)
   PRENATAL VISIT NOTE  Subjective:  Emily Herman is a 29 y.o. G3P2002 at [redacted]w[redacted]d being seen today for ongoing prenatal care.  She is currently monitored for the following issues for this high-risk pregnancy and has Supervision of high risk pregnancy, antepartum and Twin pregnancy, dichorionic/diamniotic, third trimester on their problem list.  Patient doing well with no acute concerns today. She reports no complaints.  Contractions: Irritability. Vag. Bleeding: None.  Movement: Present. Denies leaking of fluid.   The following portions of the patient's history were reviewed and updated as appropriate: allergies, current medications, past family history, past medical history, past social history, past surgical history and problem list. Problem list updated.  Objective:   Vitals:   03/26/20 1313  BP: 104/68  Pulse: 97  Weight: 158 lb 4.8 oz (71.8 kg)    Fetal Status: Fetal Heart Rate (bpm): 130/145 Fundal Height: 42 cm Movement: Present     General:  Alert, oriented and cooperative. Patient is in no acute distress.  Skin: Skin is warm and dry. No rash noted.   Cardiovascular: Normal heart rate noted  Respiratory: Normal respiratory effort, no problems with respiration noted  Abdomen: Soft, gravid, appropriate for gestational age.  Pain/Pressure: Present     Pelvic: Cervical exam performed Dilation: 3 Effacement (%): 50 Station: -2, external 3, multip cervix, fetal part not palapated  Extremities: Normal range of motion.  Edema: None  Mental Status:  Normal mood and affect. Normal behavior. Normal judgment and thought content.   Assessment and Plan:  Pregnancy: G3P2002 at [redacted]w[redacted]d  1. Supervision of high risk pregnancy, antepartum  - Flu Vaccine QUAD 36+ mos IM - Cervicovaginal ancillary only( [place]) - Culture, beta strep (group b only)  2. Twin pregnancy, dichorionic/diamniotic, third trimester Pt will have growth/BPP on 03/28/20 Per MFM delivery between 37-38  weeks After scan, discuss birth options  Preterm labor symptoms and general obstetric precautions including but not limited to vaginal bleeding, contractions, leaking of fluid and fetal movement were reviewed in detail with the patient.  Please refer to After Visit Summary for other counseling recommendations.   Return in about 1 week (around 04/02/2020) for [facility], in person.   Mariel Aloe, MD

## 2020-03-26 NOTE — Patient Instructions (Signed)
Biophysical Profile A biophysical profile is a non-invasive test that may be done during pregnancy to check that your developing baby (fetus) and your placenta are healthy. Your health care provider may recommend a biophysical profile if your pregnancy is at a higher risk for certain problems. A biophysical profile is usually done during the last 3 months of pregnancy (third trimester). A biophysical profile combines two tests to check the health of your baby. In one test, you will have a device strapped to your belly to measure your baby's heart rate. The other test involves using sound waves and a computer (ultrasound) to create an image of your baby inside your womb (uterus). Together, these tests tell your health care provider about the overall health of your baby. Tell a health care provider about:  Any allergies you have.  All medicines you are taking, including vitamins, herbs, eye drops, creams, and over-the-counter medicines.  Any medical conditions you have.  Any concerns you have about your pregnancy.  Any symptoms such as abdominal pain or contractions, nausea or vomiting, vaginal bleeding, leaking of amniotic fluid, decreased fetal movements, fever or infection, increased swelling, headaches, or visual disturbances.  How often you feel your baby move. What are the risks? There are no risks to you or your baby from a biophysical profile. What happens before the procedure? Ask your health care provider how to prepare.  You may need to drink fluids so that you have a full bladder for your ultrasound.  You may also need to eat before you arrive for the test. That makes your baby more active. What happens during the procedure?      You will lie on your back on an exam table.  Your blood pressure may be monitored during the procedure.  A belt will be placed around your belly. The belt has a sensor to measure your baby's heart rate.  You may have to wear another belt and  sensor to measure any muscle movements (contractions) in your uterus. During the ultrasound, a health care provider or technician will gently roll a handheld device (transducer) over your belly. This device sends signals to a computer that creates images of your baby.  Five areas of your baby's health and development will be checked during the biophysical profile: ? Heart rate. ? Breathing. ? Movement. ? Active muscle movement (muscle tone). ? The amount of fluid in your uterus (amniotic fluid). The procedure may vary among health care providers and hospitals. What happens after the procedure?  Your health care provider will discuss your results with you. The results of a biophysical profile are scored in a range of 0 to 10. Each area that is evaluated is given a score of 0 or 2 points. If you get a score of 6 or less, you may need further testing, or your baby may need to be delivered early. A score of 8 to 10 with normal amniotic fluid levels is considered normal.  Unless you need additional testing, you can go home right after the procedure and resume your normal activities. Summary  A biophysical profile is a non-invasive test that may be done during pregnancy to check that your developing baby (fetus) and your placenta are healthy.  A biophysical profile combines two tests: A test to measure your baby's heart rate and an ultrasound test to create an image of your baby in the womb.  Tell your health care provider about any concerns you have about your pregnancy or any pregnancy-related symptoms.  If you get a score of 6 or less, you may need further testing, or your baby may need to be delivered early. A score of 8 to 10 with normal amniotic fluid levels is considered normal. This information is not intended to replace advice given to you by your health care provider. Make sure you discuss any questions you have with your health care provider. Document Revised: 09/15/2018 Document  Reviewed: 06/27/2016 Elsevier Patient Education  2020 [employer]. [employer] ([place]).Twin and Triplet Pregnancy. [place]: [employer] ([place]); 2019.">  Multiple Pregnancy Multiple pregnancy means that a woman is carrying more than one baby at a time. She may be pregnant with twins, triplets, or more. The majority of multiple pregnancies are twins. Naturally conceiving triplets or more (higher-order multiples) is rare. Multiple pregnancies are riskier than single pregnancies. A woman with a multiple pregnancy is more likely to have certain problems during her pregnancy. How does a multiple pregnancy happen? A multiple pregnancy happens when:  The woman's body releases more than one egg at a time, and then each egg gets fertilized by a different sperm. ? This is the most common type of multiple pregnancy. ? Twins or other multiples produced this way are called fraternal. They are no more alike than non-multiple siblings are.  One sperm fertilizes one egg, which then divides into more than one embryo. ? Twins or other multiples produced this way are called identical. Identical multiples are always the same gender, and they look very much alike. Who is most likely to have a multiple pregnancy? A multiple pregnancy is more likely to develop in women who:  Have had fertility treatment, especially if the treatment included fertility medicines.  Are older than 31 years of age.  Have already had four or more children.  Have a family history of multiple pregnancy. How is a multiple pregnancy diagnosed? A multiple pregnancy may be diagnosed based on:  Symptoms such as: ? Rapid weight gain in the first 3 months of pregnancy (first trimester). ? More severe nausea and breast tenderness than what is typical of a single pregnancy. ? A larger uterus than what is normal for the stage of the pregnancy.  Blood tests that detect a higher-than-normal  level of human chorionic gonadotropin (hCG). This is a hormone that your body produces in early pregnancy.  An ultrasound exam. This is used to confirm that you are carrying multiples. What risks come with multiple pregnancy? A multiple pregnancy puts you at a higher risk for certain problems during or after your pregnancy. These include:  Delivering your babies before your due date (preterm birth). A full-term pregnancy lasts for at least 37 weeks. ? Babies born before 37 weeks may have a higher risk for breathing problems, feeding difficulties, cerebral palsy, and learning disabilities.  Diabetes.  Preeclampsia. This is a serious condition that causes high blood pressure and headaches during pregnancy.  Too much blood loss after childbirth (postpartum hemorrhage).  Postpartum depression.  Low birth weight of the babies. How will having a multiple pregnancy affect my care? Your health care team will monitor you more closely. You may need more frequent prenatal visits. This will ensure that you are healthy and that your babies are growing normally. Follow these instructions at home: Eating and drinking  Increase your nutrition. ? Follow your health care provider's recommendations for weight gain. You may need to gain a little extra weight when you are pregnant with multiples. ? Eat healthy  snacks often throughout the day. This will add calories and reduce nausea.  Drink enough fluid to keep your urine pale yellow.  Take prenatal vitamins. Ask your health care provider what vitamins are right for you. Activity Limit your activities by 20-24 weeks of pregnancy.  Rest often.  Avoid activities, exercise, and work that take a lot of effort.  Ask your health care provider when you should stop having sex. General instructions  Do not use any products that contain nicotine or tobacco, such as cigarettes, e-cigarettes, and chewing tobacco. If you need help quitting, ask your health  care provider.  Do not drink alcohol or use illegal drugs.  Take over-the-counter and prescription medicines only as told by your health care provider.  Arrange for extra help around the house.  Keep all follow-up visits and all prenatal visits as told by your health care provider. This is important. Where to find more information  [employer]etric[employer] and Gyne[employer]: www.acog.org Contact a health care provider if:  You have dizziness.  You have nausea, vomiting, or diarrhea that does not go away.  You have depression or other emotions that are interfering with your normal activities.  You have a fever.  You have pain with urination.  You have a bad-smelling vaginal discharge.  You notice increased swelling in your face, hands, legs, or ankles. Get help right away if:  You have fluid leaking from your vagina.  You have bleeding from your vagina.  You have pelvic cramps, pelvic pressure, or nagging pain in your abdomen or lower back.  You are having regular contractions.  You have a severe headache, with or without changes in how you see.  You have chest pain or shortness of breath.  You notice that your babies move less often, or do not move at all. Summary  Having a multiple pregnancy means that a woman is carrying more than one baby at a time.  A multiple pregnancy puts you at a higher risk for delivering your babies before your due date, having diabetes, preeclampsia, too much blood loss after childbirth, or low birth weight of the babies.  Your health care provider will monitor you more closely during your pregnancy.  You may need to make some lifestyle changes during pregnancy. This includes eating more, limiting your activities after 20-24 weeks of pregnancy, and arranging for extra help around the house.  Follow up with your health care provider as instructed if you experience any complications. This information is not intended to replace  advice given to you by your health care provider. Make sure you discuss any questions you have with your health care provider. Document Revised: 01/17/2019 Document Reviewed: 01/17/2019 Elsevier Patient Education  2020 [employer].

## 2020-03-27 LAB — CERVICOVAGINAL ANCILLARY ONLY
Chlamydia: NEGATIVE
Comment: NEGATIVE
Comment: NORMAL
Neisseria Gonorrhea: NEGATIVE

## 2020-03-28 ENCOUNTER — Other Ambulatory Visit: Payer: Self-pay | Admitting: Maternal & Fetal Medicine

## 2020-03-28 ENCOUNTER — Other Ambulatory Visit: Payer: Self-pay | Admitting: *Deleted

## 2020-03-28 ENCOUNTER — Ambulatory Visit: Payer: Medicaid Other | Attending: Obstetrics and Gynecology

## 2020-03-28 ENCOUNTER — Encounter: Payer: Self-pay | Admitting: *Deleted

## 2020-03-28 ENCOUNTER — Ambulatory Visit: Payer: Self-pay | Admitting: *Deleted

## 2020-03-28 ENCOUNTER — Other Ambulatory Visit: Payer: Self-pay

## 2020-03-28 DIAGNOSIS — O30049 Twin pregnancy, dichorionic/diamniotic, unspecified trimester: Secondary | ICD-10-CM

## 2020-03-28 DIAGNOSIS — O0990 Supervision of high risk pregnancy, unspecified, unspecified trimester: Secondary | ICD-10-CM

## 2020-03-28 DIAGNOSIS — O30043 Twin pregnancy, dichorionic/diamniotic, third trimester: Secondary | ICD-10-CM

## 2020-03-28 DIAGNOSIS — O0933 Supervision of pregnancy with insufficient antenatal care, third trimester: Secondary | ICD-10-CM

## 2020-03-28 DIAGNOSIS — O321XX1 Maternal care for breech presentation, fetus 1: Secondary | ICD-10-CM | POA: Diagnosis not present

## 2020-03-28 DIAGNOSIS — O322XX2 Maternal care for transverse and oblique lie, fetus 2: Secondary | ICD-10-CM | POA: Diagnosis not present

## 2020-03-28 DIAGNOSIS — Z3A35 35 weeks gestation of pregnancy: Secondary | ICD-10-CM

## 2020-03-28 DIAGNOSIS — Z362 Encounter for other antenatal screening follow-up: Secondary | ICD-10-CM

## 2020-03-30 LAB — CULTURE, BETA STREP (GROUP B ONLY): Strep Gp B Culture: NEGATIVE

## 2020-04-02 ENCOUNTER — Encounter: Payer: Self-pay | Admitting: Obstetrics & Gynecology

## 2020-04-02 ENCOUNTER — Other Ambulatory Visit: Payer: Self-pay

## 2020-04-04 ENCOUNTER — Ambulatory Visit: Payer: Self-pay | Admitting: *Deleted

## 2020-04-04 ENCOUNTER — Ambulatory Visit: Payer: Medicaid Other | Attending: Obstetrics and Gynecology

## 2020-04-04 ENCOUNTER — Other Ambulatory Visit: Payer: Self-pay

## 2020-04-04 ENCOUNTER — Encounter: Payer: Self-pay | Admitting: *Deleted

## 2020-04-04 DIAGNOSIS — O0990 Supervision of high risk pregnancy, unspecified, unspecified trimester: Secondary | ICD-10-CM | POA: Insufficient documentation

## 2020-04-04 DIAGNOSIS — O0933 Supervision of pregnancy with insufficient antenatal care, third trimester: Secondary | ICD-10-CM | POA: Diagnosis not present

## 2020-04-04 DIAGNOSIS — O30049 Twin pregnancy, dichorionic/diamniotic, unspecified trimester: Secondary | ICD-10-CM | POA: Insufficient documentation

## 2020-04-04 DIAGNOSIS — Z3A36 36 weeks gestation of pregnancy: Secondary | ICD-10-CM

## 2020-04-04 DIAGNOSIS — O30043 Twin pregnancy, dichorionic/diamniotic, third trimester: Secondary | ICD-10-CM

## 2020-04-09 ENCOUNTER — Other Ambulatory Visit: Payer: Self-pay

## 2020-04-09 ENCOUNTER — Ambulatory Visit (INDEPENDENT_AMBULATORY_CARE_PROVIDER_SITE_OTHER): Payer: Self-pay | Admitting: Obstetrics and Gynecology

## 2020-04-09 VITALS — BP 98/66 | HR 104 | Wt 163.0 lb

## 2020-04-09 DIAGNOSIS — O322XX2 Maternal care for transverse and oblique lie, fetus 2: Secondary | ICD-10-CM

## 2020-04-09 DIAGNOSIS — O0990 Supervision of high risk pregnancy, unspecified, unspecified trimester: Secondary | ICD-10-CM

## 2020-04-09 DIAGNOSIS — O322XX1 Maternal care for transverse and oblique lie, fetus 1: Secondary | ICD-10-CM | POA: Insufficient documentation

## 2020-04-09 DIAGNOSIS — Z3A37 37 weeks gestation of pregnancy: Secondary | ICD-10-CM | POA: Insufficient documentation

## 2020-04-09 DIAGNOSIS — O30043 Twin pregnancy, dichorionic/diamniotic, third trimester: Secondary | ICD-10-CM

## 2020-04-09 NOTE — Patient Instructions (Addendum)
Cesarean Delivery Cesarean birth, or cesarean delivery, is the surgical delivery of a baby through an incision in the abdomen and the uterus. This may be referred to as a C-section. This procedure may be scheduled ahead of time, or it may be done in an emergency situation. Tell a health care provider about:  Any allergies you have.  All medicines you are taking, including vitamins, herbs, eye drops, creams, and over-the-counter medicines.  Any problems you or family members have had with anesthetic medicines.  Any blood disorders you have.  Any surgeries you have had.  Any medical conditions you have.  Whether you or any members of your family have a history of deep vein thrombosis (DVT) or pulmonary embolism (PE). What are the risks? Generally, this is a safe procedure. However, problems may occur, including:  Infection.  Bleeding.  Allergic reactions to medicines.  Damage to other structures or organs.  Blood clots.  Injury to your baby. What happens before the procedure? General instructions  Follow instructions from your health care provider about eating or drinking restrictions.  If you know that you are going to have a cesarean delivery, do not shave your pubic area. Shaving before the procedure may increase your risk of infection.  Plan to have someone take you home from the hospital.  Ask your health care provider what steps will be taken to prevent infection. These may include: ? Removing hair at the surgery site. ? Washing skin with a germ-killing soap. ? Taking antibiotic medicine.  Depending on the reason for your cesarean delivery, you may have a physical exam or additional testing, such as an ultrasound.  You may have your blood or urine tested. Questions for your health care provider  Ask your health care provider about: ? Changing or stopping your regular medicines. This is especially important if you are taking diabetes medicines or blood  thinners. ? Your pain management plan. This is especially important if you plan to breastfeed your baby. ? How long you will be in the hospital after the procedure. ? Any concerns you may have about receiving blood products, if you need them during the procedure. ? Cord blood banking, if you plan to collect your baby's umbilical cord blood.  You may also want to ask your health care provider: ? Whether you will be able to hold or breastfeed your baby while you are still in the operating room. ? Whether your baby can stay with you immediately after the procedure and during your recovery. ? Whether a family member or a person of your choice can go with you into the operating room and stay with you during the procedure, immediately after the procedure, and during your recovery. What happens during the procedure?   An IV will be inserted into one of your veins.  Fluid and medicines, such as antibiotics, will be given before the surgery.  Fetal monitors will be placed on your abdomen to check your baby's heart rate.  You may be given a special warming gown to wear to keep your temperature stable.  A catheter may be inserted into your bladder through your urethra. This drains your urine during the procedure.  You may be given one or more of the following: ? A medicine to numb the area (local anesthetic). ? A medicine to make you fall asleep (general anesthetic). ? A medicine (regional anesthetic) that is injected into your back or through a small thin tube placed in your back (spinal anesthetic or epidural anesthetic).   This numbs everything below the injection site and allows you to stay awake during your procedure. If this makes you feel nauseous, tell your health care provider. Medicines will be available to help reduce any nausea you may feel.  An incision will be made in your abdomen, and then in your uterus.  If you are awake during your procedure, you may feel tugging and pulling in  your abdomen, but you should not feel pain. If you feel pain, tell your health care provider immediately.  Your baby will be removed from your uterus. You may feel more pressure or pushing while this happens.  Immediately after birth, your baby will be dried and kept warm. You may be able to hold and breastfeed your baby.  The umbilical cord may be clamped and cut during this time. This usually occurs after waiting a period of 1-2 minutes after delivery.  Your placenta will be removed from your uterus.  Your incisions will be closed with stitches (sutures). Staples, skin glue, or adhesive strips may also be applied to the incision in your abdomen.  Bandages (dressings) may be placed over the incision in your abdomen. The procedure may vary among health care providers and hospitals. What happens after the procedure?  Your blood pressure, heart rate, breathing rate, and blood oxygen level will be monitored until you are discharged from the hospital.  You may continue to receive fluids and medicines through an IV.  You will have some pain. Medicines will be available to help control your pain.  To help prevent blood clots: ? You may be given medicines. ? You may have to wear compression stockings or devices. ? You will be encouraged to walk around when you are able.  Hospital staff will encourage and support bonding with your baby. Your hospital may have you and your baby to stay in the same room (rooming in) during your hospital stay to encourage successful bonding and breastfeeding.  You may be encouraged to cough and breathe deeply often. This helps to prevent lung problems.  If you have a catheter draining your urine, it will be removed as soon as possible after your procedure. Summary  Cesarean birth, or cesarean delivery, is the surgical delivery of a baby through an incision in the abdomen and the uterus.  Follow instructions from your health care provider about eating or  drinking restrictions before the procedure.  You will have some pain after the procedure. Medicines will be available to help control your pain.  Hospital staff will encourage and support bonding with your baby after the procedure. Your hospital may have you and your baby to stay in the same room (rooming in) during your hospital stay to encourage successful bonding and breastfeeding. This information is not intended to replace advice given to you by your health care provider. Make sure you discuss any questions you have with your health care provider. Document Revised: 11/30/2017 Document Reviewed: 11/30/2017 Elsevier Patient Education  2020 [employer].  Intrauterine Device Information An intrauterine device (IUD) is a medical device that is inserted in the uterus to prevent pregnancy. It is a small, T-shaped device that has one or two nylon strings hanging down from it. The strings hang out of the lower part of the uterus (cervix) to allow for future IUD removal. There are two types of IUDs available:  Hormone IUD. This type of IUD is made of plastic and contains the hormone progestin (synthetic progesterone). A hormone IUD may last 3-5 years.  Copper  IUD. This type of IUD has copper wire wrapped around it. A copper IUD may last up to 10 years. How is an IUD inserted? An IUD is inserted through the vagina and placed into the uterus with a minor medical procedure. The exact procedure for IUD insertion may vary among health care providers and hospitals. How does an IUD work? Synthetic progesterone in a hormonal IUD prevents pregnancy by:  Thickening cervical mucus to prevent sperm from entering the uterus.  Thinning the uterine lining to prevent a fertilized egg from being implanted there. Copper in a copper IUD prevents pregnancy by making the uterus and fallopian tubes produce a fluid that kills sperm. What are the advantages of an IUD? Advantages of either type of IUD  It is highly  effective in preventing pregnancy.  It is reversible. You can become pregnant shortly after the IUD is removed.  It is low-maintenance and can stay in place for a long time.  There are no estrogen-related side effects.  It can be used when breastfeeding.  It is not associated with weight gain.  It can be inserted right after childbirth, an abortion, or a miscarriage. Advantages of a hormone IUD  If it is inserted within 7 days of your period starting, it works right after it is inserted. If the hormone IUD is inserted at any other time in your cycle, you will need to use a backup method of birth control for 7 days after insertion.  It can make menstrual periods lighter.  It can reduce menstrual cramping.  It can be used for 3-5 years. Advantages of a copper IUD  It works right after it is inserted.  It can be used as a form of emergency birth control if it is inserted within 5 days after having unprotected sex.  It does not interfere with your body's natural hormones.  It can be used for 10 years. What are the disadvantages of an IUD?  An IUD may cause irregular menstrual bleeding for a period of time after insertion.  You may have pain during insertion and have cramping and vaginal bleeding after insertion.  An IUD may cut the uterus (uterine perforation) when it is inserted. This is rare.  An IUD may cause pelvic inflammatory disease (PID), which is an infection in the uterus and fallopian tubes. This is rare, and it usually happens during the first 20 days after the IUD is inserted.  A copper IUD can make your menstrual flow heavier and more painful. How is an IUD removed?  You will lie on your back with your knees bent and your feet in footrests (stirrups).  A device will be inserted into your vagina to spread apart the vaginal walls (speculum). This will allow your health care provider to see the strings attached to the IUD.  Your health care provider will use a  small instrument (forceps) to grasp the IUD strings and pull firmly until the IUD is removed. You may have some discomfort when the IUD is removed. Your health care provider may recommend taking over-the-counter pain relievers, such as ibuprofen, before the procedure. You may also have minor spotting for a few days after the procedure. The exact procedure for IUD removal may vary among health care providers and hospitals. Is the IUD right for me? Your health care provider will make sure you are a good candidate for an IUD and will discuss the advantages, disadvantages, and possible side effects with you. Summary  An intrauterine device (IUD) is  a medical device that is inserted in the uterus to prevent pregnancy. It is a small, T-shaped device that has one or two nylon strings hanging down from it.  A hormone IUD contains the hormone progestin (synthetic progesterone). A copper IUD has copper wire wrapped around it.  Synthetic progesterone in a hormone IUD prevents pregnancy by thickening cervical mucus and thinning the walls of the uterus. Copper in a copper IUD prevents pregnancy by making the uterus and fallopian tubes produce a fluid that kills sperm.  A hormone IUD can be left in place for 3-5 years. A copper IUD can be left in place for up to 10 years.  An IUD is inserted and removed by a health care provider. You may feel some pain during insertion and removal. Your health care provider may recommend taking over-the-counter pain medicine, such as ibuprofen, before an IUD procedure. This information is not intended to replace advice given to you by your health care provider. Make sure you discuss any questions you have with your health care provider. Document Revised: 05/08/2017 Document Reviewed: 06/24/2016 Elsevier Patient Education  2020 [employer].

## 2020-04-09 NOTE — Progress Notes (Signed)
   PRENATAL VISIT NOTE  Subjective:  Emily Herman is a 29 y.o. G3P2002 at [redacted]w[redacted]d being seen today for ongoing prenatal care.  She is currently monitored for the following issues for this high-risk pregnancy and has Supervision of high risk pregnancy, antepartum; Twin pregnancy, dichorionic/diamniotic, third trimester; [redacted] weeks gestation of pregnancy; Transverse lie of fetus, fetus 1; and Transverse lie of fetus, fetus 2 on their problem list.  Patient doing well with no acute concerns today. She reports no complaints.  Contractions: Irregular. Vag. Bleeding: None.  Movement: Present. Denies leaking of fluid.   Pt seen.  Last BPP showed Transverse/transverse lie.  Pt may need primary cesarean section if position remains static.  She has another BPP on 11/3 and position will be verified then.  Discussed risks and benefits of c section including bleeding, infection, involvement of other organs as well as uterine atony.  The following portions of the patient's history were reviewed and updated as appropriate: allergies, current medications, past family history, past medical history, past social history, past surgical history and problem list. Problem list updated.  Objective:   Vitals:   04/09/20 1503  BP: 98/66  Pulse: (!) 104  Weight: 163 lb (73.9 kg)    Fetal Status: Fetal Heart Rate (bpm): 134/148 Fundal Height: 48 cm Movement: Present     General:  Alert, oriented and cooperative. Patient is in no acute distress.  Skin: Skin is warm and dry. No rash noted.   Cardiovascular: Normal heart rate noted  Respiratory: Normal respiratory effort, no problems with respiration noted  Abdomen: Soft, gravid, appropriate for gestational age.  Pain/Pressure: Present     Pelvic: Cervical exam deferred        Extremities: Normal range of motion.  Edema: None  Mental Status:  Normal mood and affect. Normal behavior. Normal judgment and thought content.   Assessment and Plan:  Pregnancy:  G3P2002 at [redacted]w[redacted]d  1. Twin pregnancy, dichorionic/diamniotic, third trimester Will schedule for primary c section on 11/5 /21 at 38 weeks per MFM recommendations, pt has last BPP on 11/3 , will recheck position at this time  2. Supervision of high risk pregnancy, antepartum Pt does desire paraguard IUD at time of delivery   3. [redacted] weeks gestation of pregnancy   4. Transverse lie of fetus, fetus 1   5. Transverse lie of fetus, fetus 2   Term labor symptoms and general obstetric precautions including but not limited to vaginal bleeding, contractions, leaking of fluid and fetal movement were reviewed in detail with the patient.  Please refer to After Visit Summary for other counseling recommendations.   No follow-ups on file.   Mariel Aloe, MD

## 2020-04-10 ENCOUNTER — Telehealth: Payer: Self-pay

## 2020-04-10 NOTE — Telephone Encounter (Signed)
-----   Message from Warden Fillers, MD sent at 04/09/2020  4:56 PM EDT ----- Regarding: c section for twins Per MFM pt needs delivery at 38 weeks, currently both twins are transverse. 38 weeks will be 04/13/20.  She has a follow up scan on  Wednesday.  If the position changes I can convert her to an induction.  Due to the malpresentation a two MD approach may not be a bad idea.  Donavan Foil

## 2020-04-10 NOTE — Telephone Encounter (Signed)
Called and spoke to miss Washington, surgery date and tie and preop instructions given. Patient expressed understanding. Advised to call us back if she had any questions

## 2020-04-11 ENCOUNTER — Other Ambulatory Visit: Payer: Self-pay

## 2020-04-11 ENCOUNTER — Encounter: Payer: Self-pay | Admitting: *Deleted

## 2020-04-11 ENCOUNTER — Ambulatory Visit: Payer: Self-pay | Admitting: *Deleted

## 2020-04-11 ENCOUNTER — Ambulatory Visit: Payer: Medicaid Other | Attending: Obstetrics and Gynecology

## 2020-04-11 DIAGNOSIS — O30043 Twin pregnancy, dichorionic/diamniotic, third trimester: Secondary | ICD-10-CM

## 2020-04-11 DIAGNOSIS — O0990 Supervision of high risk pregnancy, unspecified, unspecified trimester: Secondary | ICD-10-CM

## 2020-04-11 DIAGNOSIS — O0933 Supervision of pregnancy with insufficient antenatal care, third trimester: Secondary | ICD-10-CM | POA: Diagnosis not present

## 2020-04-11 DIAGNOSIS — O30049 Twin pregnancy, dichorionic/diamniotic, unspecified trimester: Secondary | ICD-10-CM | POA: Insufficient documentation

## 2020-04-11 DIAGNOSIS — Z3A37 37 weeks gestation of pregnancy: Secondary | ICD-10-CM | POA: Diagnosis not present

## 2020-04-12 ENCOUNTER — Encounter (HOSPITAL_COMMUNITY): Payer: Self-pay

## 2020-04-12 NOTE — Patient Instructions (Signed)
Emily Herman  04/12/2020   Your procedure is scheduled on:  04/15/2020  Arrive at 0930 at Entrance C on [address] at [facility]  and [employer]. You are invited to use the FREE valet parking or use the Visitor's parking deck.  Pick up the phone at the desk and dial [phone].  Call this number if you have problems the morning of surgery: [phone]  Remember:   Do not eat food:(After Midnight) Desps de medianoche.  Do not drink clear liquids: (After Midnight) Desps de medianoche.  Take these medicines the morning of surgery with A SIP OF WATER:  none   Do not wear jewelry, make-up or nail polish.  Do not wear lotions, powders, or perfumes. Do not wear deodorant.  Do not shave 48 hours prior to surgery.  Do not bring valuables to the hospital.  [facility] is not   responsible for any belongings or valuables brought to the hospital.  Contacts, dentures or bridgework may not be worn into surgery.  Leave suitcase in the car. After surgery it may be brought to your room.  For patients admitted to the hospital, checkout time is 11:00 AM the day of              discharge.      Please read over the following fact sheets that you were given:     Preparing for Surgery

## 2020-04-13 ENCOUNTER — Other Ambulatory Visit (HOSPITAL_COMMUNITY)
Admission: RE | Admit: 2020-04-13 | Discharge: 2020-04-13 | Disposition: A | Discharge status: Home / Self Care | Payer: Self-pay | Source: Ambulatory Visit | Attending: Family Medicine | Admitting: Family Medicine

## 2020-04-13 ENCOUNTER — Other Ambulatory Visit (INDEPENDENT_AMBULATORY_CARE_PROVIDER_SITE_OTHER): Payer: Self-pay | Admitting: Obstetrics and Gynecology

## 2020-04-13 ENCOUNTER — Encounter (HOSPITAL_COMMUNITY)
Admission: RE | Admit: 2020-04-13 | Discharge: 2020-04-13 | Disposition: A | Discharge status: Home / Self Care | Payer: Self-pay | Source: Ambulatory Visit | Attending: Family Medicine | Admitting: Family Medicine

## 2020-04-13 ENCOUNTER — Other Ambulatory Visit: Payer: Self-pay

## 2020-04-13 DIAGNOSIS — Z01812 Encounter for preprocedural laboratory examination: Secondary | ICD-10-CM | POA: Diagnosis present

## 2020-04-13 DIAGNOSIS — Z20822 Contact with and (suspected) exposure to covid-19: Secondary | ICD-10-CM | POA: Diagnosis not present

## 2020-04-13 LAB — CBC
HCT: 37.6 % (ref 36.0–46.0)
Hemoglobin: 12.1 g/dL (ref 12.0–15.0)
MCH: 25.7 pg — ABNORMAL LOW (ref 26.0–34.0)
MCHC: 32.2 g/dL (ref 30.0–36.0)
MCV: 79.8 fL — ABNORMAL LOW (ref 80.0–100.0)
Platelets: 259 10*3/uL (ref 150–400)
RBC: 4.71 MIL/uL (ref 3.87–5.11)
RDW: 13.9 % (ref 11.5–15.5)
WBC: 6.8 10*3/uL (ref 4.0–10.5)
nRBC: 0 % (ref 0.0–0.2)

## 2020-04-13 LAB — TYPE AND SCREEN
ABO/RH(D): O POS
Antibody Screen: NEGATIVE

## 2020-04-13 LAB — SARS CORONAVIRUS 2 (TAT 6-24 HRS): SARS Coronavirus 2: NEGATIVE

## 2020-04-13 MED ORDER — SOD CITRATE-CITRIC ACID 500-334 MG/5ML PO SOLN: 30.0000 mL | ORAL | Status: DC

## 2020-04-13 MED ORDER — CEFAZOLIN SODIUM-DEXTROSE 2-4 GM/100ML-% IV SOLN: 2.0000 g | INTRAVENOUS | Status: DC

## 2020-04-13 NOTE — Progress Notes (Signed)
preop c section orders

## 2020-04-14 LAB — RPR: RPR Ser Ql: NONREACTIVE

## 2020-04-15 ENCOUNTER — Inpatient Hospital Stay (HOSPITAL_COMMUNITY): Payer: Medicaid Other | Admitting: Anesthesiology

## 2020-04-15 ENCOUNTER — Inpatient Hospital Stay (HOSPITAL_COMMUNITY)
Admission: RE | Admit: 2020-04-15 | Discharge: 2020-04-17 | DRG: 787 | Disposition: A | Discharge status: Home / Self Care | Payer: Medicaid Other | Attending: Obstetrics and Gynecology | Admitting: Obstetrics and Gynecology

## 2020-04-15 ENCOUNTER — Encounter (HOSPITAL_COMMUNITY)
Admission: RE | Disposition: A | Discharge status: Home / Self Care | Payer: Self-pay | Source: Home / Self Care | Attending: Obstetrics and Gynecology

## 2020-04-15 ENCOUNTER — Encounter (HOSPITAL_COMMUNITY): Payer: Self-pay | Admitting: Obstetrics and Gynecology

## 2020-04-15 ENCOUNTER — Other Ambulatory Visit: Payer: Self-pay

## 2020-04-15 DIAGNOSIS — Z3A38 38 weeks gestation of pregnancy: Secondary | ICD-10-CM | POA: Diagnosis not present

## 2020-04-15 DIAGNOSIS — O9081 Anemia of the puerperium: Secondary | ICD-10-CM | POA: Diagnosis not present

## 2020-04-15 DIAGNOSIS — O359XX1 Maternal care for (suspected) fetal abnormality and damage, unspecified, fetus 1: Secondary | ICD-10-CM | POA: Diagnosis present

## 2020-04-15 DIAGNOSIS — O321XX1 Maternal care for breech presentation, fetus 1: Principal | ICD-10-CM | POA: Diagnosis present

## 2020-04-15 DIAGNOSIS — Z30430 Encounter for insertion of intrauterine contraceptive device: Secondary | ICD-10-CM

## 2020-04-15 DIAGNOSIS — O30043 Twin pregnancy, dichorionic/diamniotic, third trimester: Secondary | ICD-10-CM | POA: Diagnosis present

## 2020-04-15 DIAGNOSIS — D62 Acute posthemorrhagic anemia: Secondary | ICD-10-CM | POA: Diagnosis not present

## 2020-04-15 DIAGNOSIS — O0990 Supervision of high risk pregnancy, unspecified, unspecified trimester: Secondary | ICD-10-CM

## 2020-04-15 DIAGNOSIS — O328XX2 Maternal care for other malpresentation of fetus, fetus 2: Secondary | ICD-10-CM | POA: Diagnosis present

## 2020-04-15 DIAGNOSIS — Z30014 Encounter for initial prescription of intrauterine contraceptive device: Secondary | ICD-10-CM | POA: Diagnosis not present

## 2020-04-15 LAB — CBC
HCT: 36.9 % (ref 36.0–46.0)
HCT: 30 % — ABNORMAL LOW (ref 36.0–46.0)
Hemoglobin: 11.7 g/dL — ABNORMAL LOW (ref 12.0–15.0)
Hemoglobin: 9.6 g/dL — ABNORMAL LOW (ref 12.0–15.0)
MCH: 25.7 pg — ABNORMAL LOW (ref 26.0–34.0)
MCH: 26.2 pg (ref 26.0–34.0)
MCHC: 31.7 g/dL (ref 30.0–36.0)
MCHC: 32 g/dL (ref 30.0–36.0)
MCV: 80.9 fL (ref 80.0–100.0)
MCV: 82 fL (ref 80.0–100.0)
Platelets: 243 10*3/uL (ref 150–400)
Platelets: 221 10*3/uL (ref 150–400)
RBC: 4.56 MIL/uL (ref 3.87–5.11)
RBC: 3.66 MIL/uL — ABNORMAL LOW (ref 3.87–5.11)
RDW: 14.3 % (ref 11.5–15.5)
RDW: 14.2 % (ref 11.5–15.5)
WBC: 5.9 10*3/uL (ref 4.0–10.5)
WBC: 10.4 10*3/uL (ref 4.0–10.5)
nRBC: 0 % (ref 0.0–0.2)
nRBC: 0 % (ref 0.0–0.2)

## 2020-04-15 SURGERY — Surgical Case
Anesthesia: Spinal

## 2020-04-15 MED ORDER — DEXAMETHASONE SODIUM PHOSPHATE 10 MG/ML IJ SOLN
INTRAMUSCULAR | Status: AC
  Filled 2020-04-15: qty 1

## 2020-04-15 MED ORDER — SOD CITRATE-CITRIC ACID 500-334 MG/5ML PO SOLN
ORAL | Status: AC
  Filled 2020-04-15: qty 30

## 2020-04-15 MED ORDER — SIMETHICONE 80 MG PO CHEW
80.0000 mg | CHEWABLE_TABLET | Freq: Three times a day (TID) | ORAL | Status: DC
  Administered 2020-04-16 – 2020-04-17 (×4): 80 mg via ORAL
  Filled 2020-04-15 (×5): qty 1

## 2020-04-15 MED ORDER — DIPHENHYDRAMINE HCL 25 MG PO CAPS: 25.0000 mg | ORAL_CAPSULE | Freq: Four times a day (QID) | ORAL | Status: DC | PRN

## 2020-04-15 MED ORDER — DIPHENHYDRAMINE HCL 25 MG PO CAPS: 25.0000 mg | ORAL_CAPSULE | ORAL | Status: DC | PRN

## 2020-04-15 MED ORDER — KETOROLAC TROMETHAMINE 30 MG/ML IJ SOLN
30.0000 mg | Freq: Once | INTRAMUSCULAR | Status: AC | PRN
  Administered 2020-04-15: 30 mg via INTRAVENOUS

## 2020-04-15 MED ORDER — NALBUPHINE HCL 10 MG/ML IJ SOLN: 5.0000 mg | Freq: Once | INTRAMUSCULAR | Status: DC | PRN

## 2020-04-15 MED ORDER — NALOXONE HCL 4 MG/10ML IJ SOLN
1.0000 ug/kg/h | INTRAVENOUS | Status: DC | PRN
  Filled 2020-04-15: qty 5

## 2020-04-15 MED ORDER — OXYTOCIN-SODIUM CHLORIDE 30-0.9 UT/500ML-% IV SOLN
2.5000 [IU]/h | INTRAVENOUS | Status: AC
  Filled 2020-04-15: qty 500

## 2020-04-15 MED ORDER — PHENYLEPHRINE HCL-NACL 20-0.9 MG/250ML-% IV SOLN
INTRAVENOUS | Status: AC
  Filled 2020-04-15: qty 250

## 2020-04-15 MED ORDER — ACETAMINOPHEN 500 MG PO TABS
1000.0000 mg | ORAL_TABLET | Freq: Four times a day (QID) | ORAL | Status: DC
  Administered 2020-04-16 – 2020-04-17 (×6): 1000 mg via ORAL
  Filled 2020-04-15 (×7): qty 2

## 2020-04-15 MED ORDER — ENOXAPARIN SODIUM 40 MG/0.4ML ~~LOC~~ SOLN
40.0000 mg | SUBCUTANEOUS | Status: DC
  Administered 2020-04-16 – 2020-04-17 (×2): 40 mg via SUBCUTANEOUS
  Filled 2020-04-15 (×2): qty 0.4

## 2020-04-15 MED ORDER — BUPIVACAINE IN DEXTROSE 0.75-8.25 % IT SOLN
INTRATHECAL | Status: DC | PRN
  Administered 2020-04-15: 1.6 mL via INTRATHECAL

## 2020-04-15 MED ORDER — FENTANYL CITRATE (PF) 100 MCG/2ML IJ SOLN
INTRAMUSCULAR | Status: AC
  Filled 2020-04-15: qty 2

## 2020-04-15 MED ORDER — SIMETHICONE 80 MG PO CHEW
80.0000 mg | CHEWABLE_TABLET | ORAL | Status: DC | PRN
  Filled 2020-04-15: qty 1

## 2020-04-15 MED ORDER — PROMETHAZINE HCL 25 MG/ML IJ SOLN: 6.2500 mg | INTRAMUSCULAR | Status: DC | PRN

## 2020-04-15 MED ORDER — SODIUM CHLORIDE 0.9 % IV SOLN: INTRAVENOUS | Status: DC | PRN

## 2020-04-15 MED ORDER — LACTATED RINGERS IV SOLN: INTRAVENOUS | Status: DC

## 2020-04-15 MED ORDER — LACTATED RINGERS IV BOLUS
1000.0000 mL | Freq: Once | INTRAVENOUS | Status: AC
  Administered 2020-04-15: 1000 mL via INTRAVENOUS

## 2020-04-15 MED ORDER — MORPHINE SULFATE (PF) 0.5 MG/ML IJ SOLN
INTRAMUSCULAR | Status: DC | PRN
  Administered 2020-04-15: 150 ug via INTRATHECAL

## 2020-04-15 MED ORDER — MORPHINE SULFATE (PF) 0.5 MG/ML IJ SOLN
INTRAMUSCULAR | Status: AC
  Filled 2020-04-15: qty 10

## 2020-04-15 MED ORDER — DEXAMETHASONE SODIUM PHOSPHATE 10 MG/ML IJ SOLN
INTRAMUSCULAR | Status: DC | PRN
  Administered 2020-04-15: 10 mg via INTRAVENOUS

## 2020-04-15 MED ORDER — COCONUT OIL OIL
1.0000 "application " | TOPICAL_OIL | Status: DC | PRN
  Filled 2020-04-15: qty 120

## 2020-04-15 MED ORDER — MENTHOL 3 MG MT LOZG
1.0000 | LOZENGE | OROMUCOSAL | Status: DC | PRN
  Filled 2020-04-15: qty 9

## 2020-04-15 MED ORDER — ONDANSETRON HCL 4 MG/2ML IJ SOLN
INTRAMUSCULAR | Status: AC
  Filled 2020-04-15: qty 2

## 2020-04-15 MED ORDER — LACTATED RINGERS IV SOLN: INTRAVENOUS | Status: DC | PRN

## 2020-04-15 MED ORDER — IBUPROFEN 800 MG PO TABS
800.0000 mg | ORAL_TABLET | Freq: Three times a day (TID) | ORAL | Status: DC
  Administered 2020-04-16 – 2020-04-17 (×4): 800 mg via ORAL
  Filled 2020-04-15 (×5): qty 1

## 2020-04-15 MED ORDER — DIPHENHYDRAMINE HCL 50 MG/ML IJ SOLN: 12.5000 mg | INTRAMUSCULAR | Status: DC | PRN

## 2020-04-15 MED ORDER — ZOLPIDEM TARTRATE 5 MG PO TABS: 5.0000 mg | ORAL_TABLET | Freq: Every evening | ORAL | Status: DC | PRN

## 2020-04-15 MED ORDER — NALBUPHINE HCL 10 MG/ML IJ SOLN: 5.0000 mg | INTRAMUSCULAR | Status: DC | PRN

## 2020-04-15 MED ORDER — TRANEXAMIC ACID-NACL 1000-0.7 MG/100ML-% IV SOLN
INTRAVENOUS | Status: AC
  Filled 2020-04-15: qty 100

## 2020-04-15 MED ORDER — OXYCODONE HCL 5 MG/5ML PO SOLN: 5.0000 mg | Freq: Once | ORAL | Status: DC | PRN

## 2020-04-15 MED ORDER — WITCH HAZEL-GLYCERIN EX PADS: 1.0000 "application " | MEDICATED_PAD | CUTANEOUS | Status: DC | PRN

## 2020-04-15 MED ORDER — CEFAZOLIN SODIUM-DEXTROSE 2-4 GM/100ML-% IV SOLN
INTRAVENOUS | Status: AC
  Filled 2020-04-15: qty 100

## 2020-04-15 MED ORDER — NALOXONE HCL 0.4 MG/ML IJ SOLN: 0.4000 mg | INTRAMUSCULAR | Status: DC | PRN

## 2020-04-15 MED ORDER — DIBUCAINE (PERIANAL) 1 % EX OINT
1.0000 "application " | TOPICAL_OINTMENT | CUTANEOUS | Status: DC | PRN
  Filled 2020-04-15: qty 28

## 2020-04-15 MED ORDER — SENNOSIDES-DOCUSATE SODIUM 8.6-50 MG PO TABS
2.0000 | ORAL_TABLET | ORAL | Status: DC
  Administered 2020-04-15 – 2020-04-16 (×2): 2 via ORAL
  Filled 2020-04-15 (×3): qty 2

## 2020-04-15 MED ORDER — CEFAZOLIN SODIUM-DEXTROSE 2-4 GM/100ML-% IV SOLN: 2.0000 g | INTRAVENOUS | Status: DC

## 2020-04-15 MED ORDER — OXYTOCIN-SODIUM CHLORIDE 30-0.9 UT/500ML-% IV SOLN
INTRAVENOUS | Status: AC
  Filled 2020-04-15: qty 500

## 2020-04-15 MED ORDER — PARAGARD INTRAUTERINE COPPER IU IUD
INTRAUTERINE_SYSTEM | Freq: Once | INTRAUTERINE | Status: AC
  Administered 2020-04-15: 1 via INTRAUTERINE
  Filled 2020-04-15: qty 1

## 2020-04-15 MED ORDER — PHENYLEPHRINE HCL-NACL 20-0.9 MG/250ML-% IV SOLN
INTRAVENOUS | Status: DC | PRN
  Administered 2020-04-15: 60 ug/min via INTRAVENOUS

## 2020-04-15 MED ORDER — SODIUM CHLORIDE 0.9% FLUSH: 3.0000 mL | INTRAVENOUS | Status: DC | PRN

## 2020-04-15 MED ORDER — FENTANYL CITRATE (PF) 100 MCG/2ML IJ SOLN
INTRAMUSCULAR | Status: DC | PRN
  Administered 2020-04-15: 15 ug via INTRAVENOUS

## 2020-04-15 MED ORDER — OXYCODONE HCL 5 MG PO TABS: 5.0000 mg | ORAL_TABLET | Freq: Once | ORAL | Status: DC | PRN

## 2020-04-15 MED ORDER — SIMETHICONE 80 MG PO CHEW
80.0000 mg | CHEWABLE_TABLET | ORAL | Status: DC
  Administered 2020-04-15 – 2020-04-16 (×2): 80 mg via ORAL
  Filled 2020-04-15 (×3): qty 1

## 2020-04-15 MED ORDER — ALBUMIN HUMAN 5 % IV SOLN
INTRAVENOUS | Status: AC
  Filled 2020-04-15: qty 250

## 2020-04-15 MED ORDER — SCOPOLAMINE 1 MG/3DAYS TD PT72
MEDICATED_PATCH | TRANSDERMAL | Status: DC | PRN
  Administered 2020-04-15: 1 via TRANSDERMAL

## 2020-04-15 MED ORDER — OXYTOCIN-SODIUM CHLORIDE 30-0.9 UT/500ML-% IV SOLN
INTRAVENOUS | Status: DC | PRN
  Administered 2020-04-15: 30 [IU] via INTRAVENOUS

## 2020-04-15 MED ORDER — KETOROLAC TROMETHAMINE 30 MG/ML IJ SOLN
INTRAMUSCULAR | Status: AC
  Filled 2020-04-15: qty 1

## 2020-04-15 MED ORDER — MEPERIDINE HCL 25 MG/ML IJ SOLN: 6.2500 mg | INTRAMUSCULAR | Status: DC | PRN

## 2020-04-15 MED ORDER — TETANUS-DIPHTH-ACELL PERTUSSIS 5-2.5-18.5 LF-MCG/0.5 IM SUSY
0.5000 mL | PREFILLED_SYRINGE | Freq: Once | INTRAMUSCULAR | Status: DC
  Filled 2020-04-15: qty 0.5

## 2020-04-15 MED ORDER — HYDROMORPHONE HCL 1 MG/ML IJ SOLN: 0.2500 mg | INTRAMUSCULAR | Status: DC | PRN

## 2020-04-15 MED ORDER — PRENATAL MULTIVITAMIN CH
1.0000 | ORAL_TABLET | Freq: Every day | ORAL | Status: DC
  Administered 2020-04-16 – 2020-04-17 (×2): 1 via ORAL
  Filled 2020-04-15 (×3): qty 1

## 2020-04-15 MED ORDER — TRANEXAMIC ACID 1000 MG/10ML IV SOLN
INTRAVENOUS | Status: DC | PRN
  Administered 2020-04-15: 1000 mg via INTRAVENOUS

## 2020-04-15 MED ORDER — SCOPOLAMINE 1 MG/3DAYS TD PT72
MEDICATED_PATCH | TRANSDERMAL | Status: AC
  Filled 2020-04-15: qty 1

## 2020-04-15 MED ORDER — OXYCODONE HCL 5 MG PO TABS
5.0000 mg | ORAL_TABLET | ORAL | Status: DC | PRN
  Administered 2020-04-16 (×2): 5 mg via ORAL
  Filled 2020-04-15 (×2): qty 1

## 2020-04-15 MED ORDER — SOD CITRATE-CITRIC ACID 500-334 MG/5ML PO SOLN
30.0000 mL | ORAL | Status: AC
  Administered 2020-04-15: 30 mL via ORAL

## 2020-04-15 MED ORDER — ALBUMIN HUMAN 5 % IV SOLN: INTRAVENOUS | Status: DC | PRN

## 2020-04-15 MED ORDER — ONDANSETRON HCL 4 MG/2ML IJ SOLN
INTRAMUSCULAR | Status: DC | PRN
  Administered 2020-04-15 (×2): 4 mg via INTRAVENOUS

## 2020-04-15 MED ORDER — CEFAZOLIN SODIUM-DEXTROSE 2-3 GM-%(50ML) IV SOLR
INTRAVENOUS | Status: DC | PRN
  Administered 2020-04-15: 2 g via INTRAVENOUS

## 2020-04-15 SURGICAL SUPPLY — 31 items
BENZOIN TINCTURE PRP APPL 2/3 (GAUZE/BANDAGES/DRESSINGS) ×3 IMPLANT
CLOSURE WOUND 1/2 X4 (GAUZE/BANDAGES/DRESSINGS) ×1
CLOTH BEACON ORANGE TIMEOUT ST (SAFETY) ×3 IMPLANT
DRSG OPSITE POSTOP 4X10 (GAUZE/BANDAGES/DRESSINGS) ×3 IMPLANT
ELECT REM PT RETURN 9FT ADLT (ELECTROSURGICAL) ×3
ELECTRODE REM PT RTRN 9FT ADLT (ELECTROSURGICAL) ×1 IMPLANT
EXTRACTOR VACUUM KIWI (MISCELLANEOUS) IMPLANT
GAUZE SPONGE 4X4 12PLY STRL (GAUZE/BANDAGES/DRESSINGS) ×6 IMPLANT
GLOVE BIOGEL PI IND STRL 7.0 (GLOVE) ×1 IMPLANT
GLOVE BIOGEL PI INDICATOR 7.0 (GLOVE) ×2
GLOVE SURG ORTHO 8.0 STRL STRW (GLOVE) ×3 IMPLANT
GOWN STRL REUS W/TWL LRG LVL3 (GOWN DISPOSABLE) ×6 IMPLANT
KIT ABG SYR 3ML LUER SLIP (SYRINGE) IMPLANT
NEEDLE HYPO 25X5/8 SAFETYGLIDE (NEEDLE) IMPLANT
NS IRRIG 1000ML POUR BTL (IV SOLUTION) ×3 IMPLANT
PACK C SECTION WH (CUSTOM PROCEDURE TRAY) ×3 IMPLANT
PAD ABD 7.5X8 STRL (GAUZE/BANDAGES/DRESSINGS) ×6 IMPLANT
PAD OB MATERNITY 4.3X12.25 (PERSONAL CARE ITEMS) ×3 IMPLANT
PENCIL SMOKE EVAC W/HOLSTER (ELECTROSURGICAL) ×3 IMPLANT
RTRCTR C-SECT PINK 25CM LRG (MISCELLANEOUS) IMPLANT
STRIP CLOSURE SKIN 1/2X4 (GAUZE/BANDAGES/DRESSINGS) ×2 IMPLANT
SUT MON AB-0 CT1 36 (SUTURE) ×9 IMPLANT
SUT PLAIN 0 NONE (SUTURE) IMPLANT
SUT VIC AB 0 CT1 27 (SUTURE) ×4
SUT VIC AB 0 CT1 27XBRD ANBCTR (SUTURE) ×2 IMPLANT
SUT VIC AB 2-0 CT1 27 (SUTURE) ×2
SUT VIC AB 2-0 CT1 TAPERPNT 27 (SUTURE) ×1 IMPLANT
SUT VIC AB 4-0 KS 27 (SUTURE) ×3 IMPLANT
TOWEL OR 17X24 6PK STRL BLUE (TOWEL DISPOSABLE) ×3 IMPLANT
TRAY FOLEY W/BAG SLVR 14FR LF (SET/KITS/TRAYS/PACK) ×3 IMPLANT
WATER STERILE IRR 1000ML POUR (IV SOLUTION) ×3 IMPLANT

## 2020-04-15 NOTE — Lactation Note (Signed)
This note was copied from a baby's chart. Lactation Consultation Note  Patient Name: Henri Guedes TOIZT'I Date: 04/15/2020 Reason for consult: Initial assessment;Mother's request;Early term 37-38.6wks;Multiple gestation  Multiples Twin A Boy 38 weeks 6 hours old > 6 lbs.                  Twin B Girl  Same                         > 6 lbs  Mom just fed both twins 2 hours prior to [facility] visit with Similac with Iron 20 cal/oz 3 ml and 5 ml. Mom will call for assistance with latching before next feeding.   Mom does not have a pump at home. LC completed WIC form to fax to [facility] office.  LC set up DEBP and reviewed with Mom parts, assembly, cleaning and milk storage. I's and O's sheet reviewed with Mom.   Mom's nipples are flat but with stimulation will become erect. LC sized Mom with DEBP with flange 24. We pumped for a few minutes to check her flange size and comfort. Mom stated it felt fine. Drops of colostrum flowing from the left breast. Mom given spoon and snappies to collect colostrum. Mom's nipples more erect following pumping.   Plan 1 To feed based on cues 8-12x in 24 hour period no more than 3 hours without attempt. Feeding cues reviewed with Mother. Mom to offer breasts first placing infants STS and then follow up with supplementation based on guidelines reviewed of EBM or Formula.              2. Mom using slow flow nipple and we reviewed the importance of paced bottle feeding and not to use a pacifier for 4 weeks until a good latch is established.             3. LC brochure of inpatient and outpatient services reviewed.             4. Mom to call for assistance with next feeding to latch infants.              5. Plan reviewed with RN on duty.   Lilit Cinelli  Nicholson-Springer 04/15/2020, 8:10 PM

## 2020-04-15 NOTE — Anesthesia Procedure Notes (Signed)
Spinal  Patient location during procedure: OB Start time: 04/15/2020 1:23 PM End time: 04/15/2020 1:28 PM Staffing Performed: anesthesiologist  Anesthesiologist: Lowella Curb, MD Preanesthetic Checklist Completed: patient identified, IV checked, risks and benefits discussed, surgical consent, monitors and equipment checked, pre-op evaluation and timeout performed Spinal Block Patient position: sitting Prep: DuraPrep and site prepped and draped Patient monitoring: heart rate, cardiac monitor, continuous pulse ox and blood pressure Approach: midline Location: L3-4 Injection technique: single-shot Needle Needle type: Pencan  Needle gauge: 24 G Needle length: 10 cm Assessment Sensory level: T4

## 2020-04-15 NOTE — Op Note (Signed)
Washington Dominguez-Moreno PROCEDURE DATE: 04/15/2020  PREOPERATIVE DIAGNOSES: Intrauterine pregnancy at [redacted]w[redacted]d weeks gestation; malpresentation of didi twins (Twin A Breech, Twin B Footling Breech), Post-placental Paraguard IUD Placement  POSTOPERATIVE DIAGNOSES: The same  PROCEDURE: Primary Low Transverse Cesarean Section  SURGEON:  Dr. Mariel Aloe, MD   Dr. Lynnda Shields, MD  ANESTHESIOLOGY TEAM: Anesthesiologist: Lowella Curb, MD CRNA: Rica Records, CRNA  INDICATIONS: Emily Herman is a 29 y.o. [phone] at [redacted]w[redacted]d here for cesarean section secondary to the indications listed under preoperative diagnoses; please see preoperative note for further details.  The risks of surgery were discussed with the patient including but were not limited to: bleeding which may require transfusion or reoperation; infection which may require antibiotics; injury to bowel, bladder, ureters or other surrounding organs; injury to the fetus; need for additional procedures including hysterectomy in the event of a life-threatening hemorrhage; formation of adhesions; placental abnormalities wth subsequent pregnancies; incisional problems; thromboembolic phenomenon and other postoperative/anesthesia complications.  The patient concurred with the proposed plan, giving informed written consent for the procedure.    FINDINGS:   Twin A: Viable female infant in cephalic presentation.  Apgars 9 and 9.  Clear amniotic fluid.  Intact placenta, three vessel cord.  Twin B: Viable female infant in cephalic presentation.  Apgars 9 and 9.  Clear amniotic fluid.  Intact placenta, three vessel cord.    Normal uterus, fallopian tubes and ovaries bilaterally.  ANESTHESIA: Spinal INTRAVENOUS FLUIDS: 2750 ml   ESTIMATED BLOOD LOSS: 1181 ml URINE OUTPUT:  150 ml SPECIMENS: Placenta sent to pathology COMPLICATIONS: None immediate  PROCEDURE IN DETAIL:  The patient preoperatively received intravenous antibiotics  (ancef 2g) and had sequential compression devices applied to her lower extremities.  She was then taken to the operating room where spinal anesthesia was administered and was found to be adequate. She was then placed in a dorsal supine position with a leftward tilt, and prepped and draped in a sterile manner.  A foley catheter was placed into her bladder and attached to constant gravity.  After an adequate timeout was performed, a Pfannenstiel skin incision was made with scalpel and carried through to the underlying layer of fascia with use of Bovie. The fascia was incised in the midline, and this incision was extended bilaterally using the Mayo scissors.  Kocher clamps were applied to the superior aspect of the fascial incision and the underlying rectus muscles were dissected off bluntly and sharply and with use of Bovie.  A similar process was carried out on the inferior aspect of the fascial incision. The rectus muscles were separated in the midline and the peritoneum was entered bluntly. The Alexis self-retaining retractor was introduced into the abdominal cavity.  Attention was turned to the lower uterine segment where a bladder flap was created sharply and bluntly. A low transverse hysterotomy was then made with a scalpel and extended bilaterally bluntly.  Membranes of Twin A were ruptured with use of Allis clamp. Twin A (female) was successfully delivered with use of breech maneuvers; the cord was clamped and cut after one minute, and the infant was handed over to the awaiting neonatology team. Membranes of Twin B were ruptured with use of Allis clamp. Twin B (female) was successfully delivered with use of breech maneuvers; the cord was clamped and cut after one minute, and the infant was handed over to the awaiting neonatology team. Uterine massage was then administered, and the placentas of Twin A and Twin B delivered intact with three-vessel cords. The  uterus was then cleared of clots and debris.  The  hysterotomy was closed with 0 Monocryl in a running locked fashion, and an imbricating layer was also placed with 0 Monocryl.  One figure-of-eight 0 Monocryl serosal stitch was placed to help with hemostasis.  The pelvis was irrigated and cleared of all clot and debris. Hemostasis was confirmed on all surfaces.  The retractor was removed.  The peritoneum was closed with a 0 Vicryl running stitch. The fascia was then closed using 0 Vicryl in a running fashion.  The subcutaneous layer was irrigated, and the skin was closed with a 4-0 Vicryl subcuticular stitch. The patient tolerated the procedure well. Sponge, instrument and needle counts were correct x 3.  She was taken to the recovery room in stable condition.   Sheila Oats, MD OB Fellow, Faculty [facility] 04/15/2020 2:58 PM

## 2020-04-15 NOTE — Transfer of Care (Signed)
Immediate Anesthesia Transfer of Care Note  Patient: Emily Herman  Procedure(s) Performed: CESAREAN SECTION (N/A )  Patient Location: PACU  Anesthesia Type:Spinal  Level of Consciousness: awake  Airway & Oxygen Therapy: spont. resp.  st-op Assessment: Report given to RN and Post -op Vital signs reviewed and stable  Post vital signs: Reviewed and stable  Last Vitals:  Vitals Value Taken Time  BP 109/94 04/15/20 1446  Temp    Pulse 74 04/15/20 1446  Resp 20 04/15/20 1446  SpO2 99 % 04/15/20 1446  Vitals shown include unvalidated device data.  Last Pain:  Vitals:   04/15/20 1007  TempSrc: Oral         Complications: No complications documented.

## 2020-04-15 NOTE — Discharge Summary (Signed)
  Postpartum Discharge Summary  Date of Service updated 04/17/20    Patient Name: Emily Herman DOB: 11/14/1990 MRN: [id number]  Date of admission: 04/15/2020 Delivery date:   Boan, BoyA Glessie [[id number]]  04/15/2020    Sermons, GirlB Lashica [[id number]]  04/15/2020   Delivering provider:    Pedrosa, BoyA Naimah [[id number]]  BASS, LAWRENCE A    Rosner, GirlB Nieshia [[id number]]  BASS, LAWRENCE A   Date of discharge: 04/17/2020  Admitting diagnosis: Twin pregnancy, dichorionic/diamniotic, third trimester [O30.043] Supervision of high-risk pregnancy [O09.90] Intrauterine pregnancy: [redacted]w[redacted]d     Secondary diagnosis:  Principal Problem:   Cesarean delivery delivered Active Problems:   Supervision of high risk pregnancy, antepartum   Twin pregnancy, dichorionic/diamniotic, third trimester   Supervision of high-risk pregnancy   Acute blood loss anemia   Postpartum hemorrhage  Additional problems: as noted above  Discharge diagnosis: Primary Cesarean 2/2 Malpresentation of DiDi Twins                                        Post partum procedures:post-placental Paraguard IUD Augmentation: N/A Complications: none  Hospital course: Sceduled C/S   29 y.o. yo G3P2002 at [redacted]w[redacted]d was admitted to the hospital 04/15/2020 for scheduled cesarean section with the following indication:Malpresentation of DiDi Twins.Delivery details are as follows:  Membrane Rupture Time/Date:    Camarena, BoyA Delsie [[id number]]  1:48 PM    Hargadon, GirlB Lollie [[id number]]  1:51 PM  ,   Pridgen, BoyA Anajulia [[id number]]  04/15/2020    Dicker, GirlB Ajiah [[id number]]  04/15/2020    Delivery Method:   Dicocco, BoyA Jarelly [[id number]]  C-Section, Low Transverse    Alleman, GirlB Rhena [[id number]]  C-Section, Low Transverse  Details of operation can be found in separate operative  note. Paraguard IUD placed prior to uterine closure in OR. Patient had an uncomplicated postpartum course. She is ambulating, tolerating a regular diet, passing flatus, and urinating well. Patient is discharged home in stable condition on  04/17/20        Newborn Data: Birth date:   Semrad, BoyA Magdalena [[id number]]  04/15/2020    Heavrin, GirlB Krysia [[id number]]  04/15/2020   Birth time:   Kea, BoyA Bernadett [[id number]]  1:49 PM    Martinson, GirlB Shavelle [[id number]]  1:52 PM   Gender:   Siems, BoyA Lenisha [[id number]]  Female    Ricci, GirlB Reanna [[id number]]  Female   Living status:   Bun, BoyA Teneil [[id number]]  Living    Bujak, GirlB Glender [[id number]]  Living   Apgars:   Fergusson, BoyA Kimaya [[id number]]  9    Laymon, GirlB Jamirra [[id number]]  9  ,   Mapp, BoyA Hollin [[id number]]  9    Stradford, GirlB Eulogia [[id number]]  9   Weight:   Assefa, BoyA Zaelynn [[id number]]  3080 g    Gashi, GirlB Enna [[id number]]  3060 g      Magnesium Sulfate received: No BMZ received: No Rhophylac:N/A MMR:N/A T-DaP:Given prenatally Flu: Yes Transfusion:No  Physical exam  Vitals:   04/16/20 0710 04/16/20 1123 04/16/20 1351 04/16/20 2132  BP:  (!) 90/56 95/62 93/62  Pulse:   72 83  Resp: 17  17 14  Temp:   98.2 F (36.8 C) 97.9 F (36.6 C)  TempSrc:   Oral Oral  SpO2: 100%  98% 98%   General: alert, cooperative and no distress Lochia: appropriate Uterine Fundus:   firm Incision: Dressing is clean, dry, and intact DVT Evaluation: No evidence of DVT seen on physical exam. Labs: Lab Results  Component Value Date   WBC 10.8 (H) 04/16/2020   HGB 7.8 (L) 04/16/2020   HCT 24.6 (L) 04/16/2020   MCV 79.9 (L) 04/16/2020   PLT 192 04/16/2020   No flowsheet data found. Edinburgh  Score: Edinburgh Postnatal Depression Scale Screening Tool 04/16/2020  I have been able to laugh and see the funny side of things. 0  I have looked forward with enjoyment to things. 0  I have blamed myself unnecessarily when things went wrong. 1  I have been anxious or worried for no good reason. 0  I have felt scared or panicky for no good reason. 0  Things have been getting on top of me. 1  I have been so unhappy that I have had difficulty sleeping. 0  I have felt sad or miserable. 0  I have been so unhappy that I have been crying. 0  The thought of harming myself has occurred to me. 0  Edinburgh Postnatal Depression Scale Total 2     After visit meds:  Allergies as of 04/17/2020   No Known Allergies     Medication List    STOP taking these medications   folic acid 1 MG tablet Commonly known as: FOLVITE     TAKE these medications   acetaminophen 500 MG tablet Commonly known as: TYLENOL Take 500-1,000 mg by mouth every 6 (six) hours as needed (pain.).   ascorbic acid 250 MG tablet Commonly known as: VITAMIN C Take 1 tablet (250 mg total) by mouth every other day.   coconut oil Oil Apply 1 application topically as needed.   ferrous sulfate 325 (65 FE) MG tablet Take 1 tablet (325 mg total) by mouth every other day.   ibuprofen 600 MG tablet Commonly known as: ADVIL Take 1 tablet (600 mg total) by mouth every 6 (six) hours as needed.   oxyCODONE 5 MG immediate release tablet Commonly known as: Oxy IR/ROXICODONE Take 1-2 tablets (5-10 mg total) by mouth every 4 (four) hours as needed for moderate pain.   polyethylene glycol 17 g packet Commonly known as: MiraLax Take 17 g by mouth daily.   prenatal multivitamin Tabs tablet Take 1 tablet by mouth daily.     Discharge home in stable condition Infant Feeding: breast & bottle Infant Disposition:home with mother Discharge instruction: per After Visit Summary and Postpartum booklet. Activity: Advance as tolerated.  Pelvic rest for 6 weeks.  Diet: routine diet Future Appointments:No future appointments. Follow up Visit: Message sent to [facility] on 04/15/20.  Please schedule this patient for a In person postpartum visit in 4 weeks with the following provider: Any provider. Additional Postpartum F/U:Incision check 1 week  High risk pregnancy complicated by: DiDi Twins, Bilateral Pyelectasis of Twin A Delivery mode:     Tietje, BoyA Orlanda [[id number]]  C-Section, Low Transverse    Deeds, GirlB Anissia [[id number]]  C-Section, Low Transverse  Anticipated Birth Control:  PP IUD placed  04/17/2020 Alicia C Firestone, MD   

## 2020-04-15 NOTE — Anesthesia Preprocedure Evaluation (Signed)
Anesthesia Evaluation  Patient identified by MRN, date of birth, ID band Patient awake    Reviewed: Allergy & Precautions, NPO status , Patient's Chart, lab work & pertinent test results  Airway Mallampati: II  TM Distance: >3 FB Neck ROM: Full    Dental no notable dental hx.    Pulmonary neg pulmonary ROS,    Pulmonary exam normal breath sounds clear to auscultation       Cardiovascular negative cardio ROS Normal cardiovascular exam Rhythm:Regular Rate:Normal     Neuro/Psych negative neurological ROS  negative psych ROS   GI/Hepatic negative GI ROS, Neg liver ROS,   Endo/Other  negative endocrine ROS  Renal/GU negative Renal ROS  negative genitourinary   Musculoskeletal negative musculoskeletal ROS (+)   Abdominal   Peds negative pediatric ROS (+)  Hematology negative hematology ROS (+)   Anesthesia Other Findings   Reproductive/Obstetrics (+) Pregnancy                             Anesthesia Physical Anesthesia Plan  ASA: II  Anesthesia Plan: Spinal   Post-op Pain Management:    Induction:   PONV Risk Score and Plan: 2 and Treatment may vary due to age or medical condition  Airway Management Planned: Natural Airway  Additional Equipment:   Intra-op Plan:   Post-operative Plan:   Informed Consent: I have reviewed the patients History and Physical, chart, labs and discussed the procedure including the risks, benefits and alternatives for the proposed anesthesia with the patient or authorized representative who has indicated his/her understanding and acceptance.     Dental advisory given  Plan Discussed with: CRNA  Anesthesia Plan Comments:         Anesthesia Quick Evaluation  

## 2020-04-15 NOTE — Anesthesia Postprocedure Evaluation (Signed)
Anesthesia Post Note  Patient: Emily Herman  Procedure(s) Performed: CESAREAN SECTION (N/A )     Patient location during evaluation: PACU Anesthesia Type: Spinal Level of consciousness: awake and alert Pain management: pain level controlled Vital Signs Assessment: post-procedure vital signs reviewed and stable Respiratory status: spontaneous breathing, nonlabored ventilation and respiratory function stable Cardiovascular status: blood pressure returned to baseline and stable Postop Assessment: no apparent nausea or vomiting Anesthetic complications: no   No complications documented.  Last Vitals:  Vitals:   04/15/20 1632 04/15/20 1633  BP:  101/68  Pulse: 74 76  Resp: 18 17  Temp:    SpO2: 99% 100%    Last Pain:  Vitals:   04/15/20 1602  TempSrc:   PainSc: 2    Pain Goal:    LLE Motor Response: Purposeful movement (04/15/20 1602) LLE Sensation: Decreased (04/15/20 1602) RLE Motor Response: Purposeful movement (04/15/20 1602) RLE Sensation: Decreased (04/15/20 1602)     Epidural/Spinal Function Cutaneous sensation: Able to Wiggle Toes (04/15/20 1602), Patient able to flex knees: Yes (04/15/20 1602), Patient able to lift hips off bed: No (04/15/20 1602), Back pain beyond tenderness at insertion site: No (04/15/20 1602), Progressively worsening motor and/or sensory loss: No (04/15/20 1602), Bowel and/or bladder incontinence post epidural: No (04/15/20 1602)  Lowella Curb

## 2020-04-15 NOTE — Lactation Note (Signed)
This note was copied from a baby's chart. Lactation Consultation Note  Patient Name: Emily Herman OACZY'S Date: 04/15/2020 Reason for consult: Initial assessment;Mother's request;Early term 37-38.6wks;Multiple gestation    Multiples Twin A Boy 38 weeks 6 hours old > 6 lbs.                  Twin B Girl  Same                         > 6 lbs  Mom just fed both twins 2 hours prior to [facility] visit with Similac with Iron 20 cal/oz 3 ml and 5 ml. Mom will call for assistance with latching before next feeding.   Mom does not have a pump at home. LC completed WIC form to fax to [facility] office.  LC set up DEBP and reviewed with Mom parts, assembly, cleaning and milk storage. I's and O's sheet reviewed with Mom.   Mom's nipples are flat but with stimulation will become erect. LC sized Mom with DEBP with flange 24. We pumped for a few minutes to check her flange size and comfort. Mom stated it felt fine. Drops of colostrum flowing from the left breast. Mom given spoon and snappies to collect colostrum. Mom's nipples more erect following pumping.   Plan 1 To feed based on cues 8-12x in 24 hour period no more than 3 hours without attempt. Feeding cues reviewed with Mother. Mom to offer breasts first placing infants STS and then follow up with supplementation based on guidelines reviewed of EBM or Formula.              2. Mom using slow flow nipple and we reviewed the importance of paced bottle feeding and not to use a pacifier for 4 weeks until a good latch is established.             3. LC brochure of inpatient and outpatient services reviewed.             4. Mom to call for assistance with next feeding to latch infants.              5. Plan reviewed with RN on duty.     Ajeenah Heiny  Nicholson-Springer 04/15/2020, 8:26 PM

## 2020-04-15 NOTE — Discharge Instructions (Signed)

## 2020-04-15 NOTE — H&P (Signed)
OBSTETRIC ADMISSION HISTORY AND PHYSICAL  Emily Herman is a 29 y.o. female G3P2002 with IUP at [redacted]w[redacted]d by 23 week ultrasound presenting for scheduled primary Cesarean secondary to malpresentation with didi twin pregnancy. She reports +FMs, No LOF, no VB, no blurry vision, headaches or peripheral edema, and RUQ pain.  She plans on breast and bottle feeding. She requests Copper IUD for birth control.  She received her prenatal [facility] at [facility]   Dating: By [redacted]w[redacted]d ultrasound --->  Estimated Date of Delivery: 04/27/20  Sono:  @[redacted]w[redacted]d , CWD, normal anatomy  Twin A: female, breech presentation, 2774g, 53% EFW, BPP 8/8  Twin B: female, transverse presentation (fetal head to maternal R), 2559g, 29% EFW, BPP 8/8  Prenatal History/Complications:  -Didi Twin Pregnancy (natural conception) -Fetal Bilateral Pyelectasis 0.8cm dilated (Twin A)  Past Medical History: Past Medical History:  Diagnosis Date  . Medical history non-contributory     Past Surgical History: Past Surgical History:  Procedure Laterality Date  . NO PAST SURGERIES      Obstetrical History: OB History    Gravida  3   Para  2   Term  2   Preterm  0   AB  0   Living  2     SAB  0   TAB  0   Ectopic  0   Multiple  0   Live Births  2           Social History Social History   Socioeconomic History  . Marital status: Married    Spouse name: Not on file  . Number of children: Not on file  . Years of education: Not on file  . Highest education level: Not on file  Occupational History  . Not on file  Tobacco Use  . Smoking status: Never Smoker  . Smokeless tobacco: Never Used  Vaping Use  . Vaping Use: Never used  Substance and Sexual Activity  . Alcohol use: No  . Drug use: No  . Sexual activity: Yes    Birth control/protection: None  Other Topics Concern  . Not on file  Social History Narrative  . Not on file   Social Determinants of Health   Financial Resource Strain:   .  Difficulty of Paying Living Expenses: Not on file  Food Insecurity: No Food Insecurity  . Worried About 10/8 in the Last Year: Never true  . Ran Out of Food in the Last Year: Never true  Transportation Needs: No Transportation Needs  . Lack of Transportation (Medical): No  . Lack of Transportation (Non-Medical): No  Physical Activity:   . Days of Exercise per Week: Not on file  . Minutes of Exercise per Session: Not on file  Stress:   . Feeling of Stress : Not on file  Social Connections:   . Frequency of Communication with Friends and Family: Not on file  . Frequency of Social Gatherings with Friends and Family: Not on file  . Attends Religious Services: Not on file  . Active Member of Clubs or Organizations: Not on file  . Attends Programme researcher, broadcasting/film/video Meetings: Not on file  . Marital Status: Not on file    Family History: Family History  Problem Relation Age of Onset  . Hypertension Maternal Grandmother     Allergies: No Known Allergies  Medications Prior to Admission  Medication Sig Dispense Refill Last Dose  . acetaminophen (TYLENOL) 500 MG tablet Take 500-1,000 mg by mouth every 6 (six) hours  as needed (pain.). (Patient not taking: Reported on 04/11/2020)     . Prenatal Vit-Fe Fumarate-FA (PRENATAL MULTIVITAMIN) TABS tablet Take 1 tablet by mouth daily.    04/15/2020 at Unknown time  . folic acid (FOLVITE) 1 MG tablet Take 1 tablet (1 mg total) by mouth daily. (Patient not taking: Reported on 03/26/2020) 30 tablet 10      Review of Systems   All systems reviewed and negative except as stated in HPI  Blood pressure 107/68, pulse 81, temperature 98.2 F (36.8 C), temperature source Oral, resp. rate 17, last menstrual period 07/01/2019, SpO2 96 %. General appearance: alert, cooperative and appears stated age Lungs: normal WOB Heart: regular rate Abdomen: soft, non-tender Extremities: no sign of DVT Presentation: Twin A breech, Twin B  transverse/oblique Fetal monitoring: 135 on doppler   Prenatal labs: ABO, Rh: --/--/O POS (11/05 1008) Antibody: NEG (11/05 1008) Rubella: Immune (07/15 0000) RPR: NON REACTIVE (11/05 1030)  HBsAg: Negative (07/15 0000)  HIV: Non-reactive (07/15 0000)  GBS: Negative/-- (10/18 1402)  1 hr Glucola wnl Genetic screening none Anatomy US wnl  Prenatal Transfer Tool  Maternal Diabetes: No Genetic Screening: Normal Maternal Ultrasounds/Referrals: Normal except for bilateral pyelectasis 0.8cm dilated (Twin A-female) Fetal Ultrasounds or other Referrals:  Referred to Materal Fetal Medicine  Maternal Substance Abuse:  No Significant Maternal Medications:  None Significant Maternal Lab Results: Group B Strep negative  No results found for this or any previous visit (from the past 24 hour(s)).  Patient Active Problem List   Diagnosis Date Noted  . [redacted] weeks gestation of pregnancy 04/09/2020  . Transverse lie of fetus, fetus 1 04/09/2020  . Transverse lie of fetus, fetus 2 04/09/2020  . Twin pregnancy, dichorionic/diamniotic, third trimester 01/25/2020  . Supervision of high risk pregnancy, antepartum 09/25/2014    Assessment/Plan:  Emily Herman is a 29 y.o. G3P2002 at [redacted]w[redacted]d here for scheduled Cesarean secondary to DiDi Twin pregnancy with fetal presentation Twin A breech & Twin B transverse/oblique.  #Scheduled Primary Cesarean secondary to DiDi Twin pregnancy with fetal malpresentation, Twin A breech & Twin B transverse/oblique. The risks of cesarean section were discussed with the patient including but were not limited to: bleeding which may require transfusion or reoperation; infection which may require antibiotics; injury to bowel, bladder, ureters or other surrounding organs; injury to the fetus; need for additional procedures including hysterectomy in the event of a life-threatening hemorrhage; placental abnormalities wth subsequent pregnancies, incisional problems,  thromboembolic phenomenon and other postoperative/anesthesia complications.  The patient concurred with the proposed plan, giving informed written consent for the procedure.  Patient has been NPO since yesterday; she will remain NPO for procedure. Anesthesia and OR aware.  Preoperative prophylactic antibiotics (ancef 2g) and SCDs ordered on call to the OR.  To OR when ready.  #Pain: per anesthesia #FWB: 135 on dopplers #ID: GBS negative; plan for ancef 2g prior to OR #MOF: breast & bottle #MOC: plan for post-placental paraguard IUD #Circ: no #Fetal Bilateral Pyelectasis (Twin A): peds notified #Twin Pregnancy: will have low threshold to administer TXA given increased risk of PPH  Sheila Oats, MD  04/15/2020, 11:09 AM

## 2020-04-16 ENCOUNTER — Encounter: Payer: Self-pay | Admitting: Obstetrics & Gynecology

## 2020-04-16 LAB — CBC
HCT: 24.6 % — ABNORMAL LOW (ref 36.0–46.0)
Hemoglobin: 7.8 g/dL — ABNORMAL LOW (ref 12.0–15.0)
MCH: 25.3 pg — ABNORMAL LOW (ref 26.0–34.0)
MCHC: 31.7 g/dL (ref 30.0–36.0)
MCV: 79.9 fL — ABNORMAL LOW (ref 80.0–100.0)
Platelets: 192 10*3/uL (ref 150–400)
RBC: 3.08 MIL/uL — ABNORMAL LOW (ref 3.87–5.11)
RDW: 14 % (ref 11.5–15.5)
WBC: 10.8 10*3/uL — ABNORMAL HIGH (ref 4.0–10.5)
nRBC: 0 % (ref 0.0–0.2)

## 2020-04-16 NOTE — Progress Notes (Addendum)
Post Partum Day 1 Subjective: no complaints, up ad lib, voiding and tolerating PO  Objective: Blood pressure 91/62, pulse 70, temperature 98.1 F (36.7 C), temperature source Oral, resp. rate 18, last menstrual period 07/01/2019, SpO2 100 %, unknown if currently breastfeeding.  Physical Exam:  General: alert and no distress Lochia: appropriate Uterine Fundus: firm Incision: healing well, no significant drainage, no dehiscence DVT Evaluation: No evidence of DVT seen on physical exam. No cords or calf tenderness. No significant calf/ankle edema.  Recent Labs    04/15/20 1633 04/16/20 0625  HGB 9.6* 7.8*  HCT 30.0* 24.6*    Assessment/Plan: Breastfeeding, Lactation consult and Contraception s/p postplacental paraguard   LOS: 1 day   Emily Herman 04/16/2020, 7:49 AM    CNM attestation:  I have seen and examined this patient and agree with above documentation in the resident student's note.   Emily Herman is a 29 y.o. G3P3004 at [redacted]w[redacted]d post-op day 1 from pLTCS.  Has not passed flatus yet.   Gen: calm comfortable, NAD Resp: normal effort, no distress Heart: Regular rate Abd: Soft, NT, gravid, dressing is clean, dry and intact.   ROS, labs, PMH reviewed    Assessment: Post-op day one, patient's pain is well controlled.   Plan: -routine post-op care, continue to encourage movement and OOB To stimulate bowels and flatus.    Emily Herman, CNM 04/16/2020 11:44 AM

## 2020-04-16 NOTE — Lactation Note (Signed)
This note was copied from a baby's chart. Lactation Consultation Note  Patient Name: Emily Herman HWKGS'U Date: 04/16/2020 Reason for consult: Follow-up assessment;Mother's request;Early term 37-38.6wks;Infant weight loss;Multiple gestation  Visited with mom of 57 hours old ETI twins, these are mom's 3rd and 4th baby respectively. She has some experience BF and told LC that babies have been latching on a lot better today in comparison of what they did yesterday; she feels like babies are transferring. She denies any pain/discomfort when putting babies to breast.  Offered assistance with latch but mom politely declined, she told LC babies just fed formula. Parents have been supplementing with Similac 20 calorie formula every 3 hours after feedings; however mom hasn't been pumping consistently, she told LC she's only pumped twice today.  Explained to mom the importance of consistent pumping for the onset of lactogenesis II, especially with twins. Mom voiced understanding but also told LC she's planning to combo feed per feeding choice on admission, so she may not be pumping 8 times/24 hours.   Reviewed normal newborn behavior, feeding cues, cluster feeding, size of baby's stomach, supplementation guidelines and lactogenesis II.   Feeding plan:  1. Encouraged mom to keep putting babies to breast 8-12 times/24 hours or sooner if feeding cues are present 2. Pumping every 3 hours after feedings was also encouraged 3. Parents will continue supplementing with EBM/Similac 20 calorie formula, 10-20 ml/feeding  Family is Spanish speaking and requested LC consultation in Spanish. Dad present and supportive. Parents reported all questions and concerns were answered, they're both aware of LC OP services and will call PRN.  Maternal Data    Feeding Feeding Type: Breast Milk with Formula added Nipple Type: Extra Slow Flow  LATCH Score                    Interventions Interventions: Breast feeding basics reviewed;DEBP  Lactation Tools Discussed/Used Tools: Pump Flange Size: 24 Breast pump type: Double-Electric Breast Pump   Consult Status Consult Status: Follow-up Date: 04/17/20 Follow-up type: In-patient    Emily Herman 04/16/2020, 7:03 PM

## 2020-04-17 DIAGNOSIS — D62 Acute posthemorrhagic anemia: Secondary | ICD-10-CM | POA: Diagnosis not present

## 2020-04-17 LAB — SURGICAL PATHOLOGY

## 2020-04-17 MED ORDER — FERROUS SULFATE 325 (65 FE) MG PO TABS
325.0000 mg | ORAL_TABLET | ORAL | Status: DC
  Administered 2020-04-17: 325 mg via ORAL
  Filled 2020-04-17: qty 1

## 2020-04-17 MED ORDER — COCONUT OIL OIL: 1.0000 "application " | TOPICAL_OIL | 0 refills | Status: DC | PRN

## 2020-04-17 MED ORDER — IBUPROFEN 600 MG PO TABS: 600.0000 mg | ORAL_TABLET | Freq: Four times a day (QID) | ORAL | Status: AC | PRN

## 2020-04-17 MED ORDER — POLYETHYLENE GLYCOL 3350 17 G PO PACK: 17.0000 g | PACK | Freq: Every day | ORAL | 0 refills | Status: DC

## 2020-04-17 MED ORDER — VITAMIN C 250 MG PO TABS
250.0000 mg | ORAL_TABLET | ORAL | Status: DC
  Administered 2020-04-17: 250 mg via ORAL
  Filled 2020-04-17: qty 1

## 2020-04-17 MED ORDER — FERROUS SULFATE 325 (65 FE) MG PO TABS
325.0000 mg | ORAL_TABLET | ORAL | 0 refills | Status: DC
Start: 2020-04-17 — End: 2020-05-17

## 2020-04-17 MED ORDER — ASCORBIC ACID 250 MG PO TABS: 250.0000 mg | ORAL_TABLET | ORAL | 0 refills | Status: AC

## 2020-04-17 MED ORDER — OXYCODONE HCL 5 MG PO TABS
5.0000 mg | ORAL_TABLET | ORAL | 0 refills | Status: DC | PRN
Start: 2020-04-17 — End: 2020-05-17

## 2020-04-17 NOTE — Lactation Note (Signed)
This note was copied from a baby's chart. Lactation Consultation Note  Patient Name: Emily Herman [id number] Date: 04/17/2020 Reason for consult: Follow-up assessment;Early term 37-38.6wks;Infant weight loss;Multiple gestation  Visited with mom of 76 hours old ETI twins, baby A is at 4% weight loss, baby B is at 6% weight loss, mom and babies are going home today. Both of babies birth weight were > 6 lbs, they've been going to breast consistently and also parents have been supplementing with Similac 20 calorie formula after feedings at the breast.  Mom told LC that BF is going better now, and that she got a little more sleep last night that what she did the night before. Mom voiced that her breast are getting heavier, upon assessment, breast feel slightly heavy with some areolar edema, breast are filling it; colostrum is streaming easily upon compression.   Educated mom on the prevention and treatment of engorgement, sore nipples, discharge instructions and red flags on when to call babies' doctor. WIC referral was sent by previous LC, mom aware she needs to call the office today upon discharge and she'll have someone else to pick up the [facility] pump for her.   Feeding plan:  1. Encouraged mom to keep putting babies to breast 8-12 times/24 hours or sooner if feeding cues are present 2. Pumping every 3 hours after feedings was also encouraged 3. Parents will continue supplementing with EBM/Similac 20 calorie formula, starting at 20 ml/feeding; they're aware that amount will increase once babies turn 48 and 72 hours older respectively  Family is Spanish speaking and requested LC consultation in Spanish. Dad present and supportive. Parents reported all questions and concerns were answered, they're both aware of LC OP services and will contact as needed.   Maternal Data    Feeding Feeding Type: Breast Fed  LATCH Score                   Interventions Interventions:  Breast feeding basics reviewed;DEBP;Breast compression;Hand express;Breast massage  Lactation Tools Discussed/Used Tools: Pump Breast pump type: Double-Electric Breast Pump   Consult Status Consult Status: Complete Date: 04/17/20 Follow-up type: Call as needed    Jorel Gravlin Venetia Constable 04/17/2020, 9:02 AM

## 2020-04-23 ENCOUNTER — Other Ambulatory Visit: Payer: Self-pay

## 2020-04-23 ENCOUNTER — Ambulatory Visit (INDEPENDENT_AMBULATORY_CARE_PROVIDER_SITE_OTHER): Payer: Self-pay

## 2020-04-23 VITALS — BP 105/73 | HR 87 | Wt 138.0 lb

## 2020-04-23 DIAGNOSIS — Z5189 Encounter for other specified aftercare: Secondary | ICD-10-CM

## 2020-04-23 NOTE — Progress Notes (Signed)
Pt here today for wound check following primary c-section on 04/15/20. Pt denies any pain today, reports continuing Tylenol/ibuprofen preventatively. Reports vaginal spotting. Breast and formula feeding, no issues. Incision today is clean and dry with well approximated edges. Steri strips remain in place. Reviewed good wound care and s/s of infection. Pt encouraged to follow up as needed. Will return for PP appt on 05/17/20 with Donavan Foil, MD.  Fleet Contras RN 04/23/20

## 2020-04-23 NOTE — Progress Notes (Signed)
Patient was assessed and managed by nursing staff during this encounter. I have reviewed the chart and agree with the documentation and plan. I have also made any necessary editorial changes.  Warden Fillers, MD 04/23/2020 12:49 PM

## 2020-05-09 ENCOUNTER — Telehealth: Payer: Self-pay | Admitting: Obstetrics and Gynecology

## 2020-05-09 NOTE — Telephone Encounter (Signed)
Pt states she had C-Section 04-15-20 and is having leakage from incision site, Needs a call back . thanks

## 2020-05-09 NOTE — Telephone Encounter (Signed)
Returned patients call. She did not answer. LM For her to call the office at her convenience.

## 2020-05-17 ENCOUNTER — Ambulatory Visit (INDEPENDENT_AMBULATORY_CARE_PROVIDER_SITE_OTHER): Payer: Self-pay | Admitting: Obstetrics and Gynecology

## 2020-05-17 ENCOUNTER — Other Ambulatory Visit: Payer: Self-pay

## 2020-05-17 ENCOUNTER — Encounter: Payer: Self-pay | Admitting: Obstetrics and Gynecology

## 2020-05-17 ENCOUNTER — Encounter: Payer: Self-pay | Admitting: General Practice

## 2020-05-17 DIAGNOSIS — Z975 Presence of (intrauterine) contraceptive device: Secondary | ICD-10-CM | POA: Insufficient documentation

## 2020-05-17 NOTE — Progress Notes (Signed)
Post Partum Visit Note  [place] is a 29 y.o. [id number] female who presents for a postpartum visit. She is 4 weeks postpartum following a primary cesarean section.  I have fully reviewed the prenatal and intrapartum course. The delivery was at 38.2 gestational weeks.  Anesthesia: spinal. Postpartum course has been uncomplicated. Babies are doing well. Baby is feeding by both breast and bottle - Gerber. Bleeding staining only. Bowel function is normal. Bladder function is normal. Patient is not sexually active. Contraception method is IUD. Postpartum depression screening: negative.   The pregnancy intention screening data noted above was reviewed. Potential methods of contraception were discussed. The patient elected to proceed with IUD or IUS.    Edinburgh Postnatal Depression Scale - 05/17/20 1134      Edinburgh Postnatal Depression Scale:  In the Past 7 Days   I have been able to laugh and see the funny side of things. 0    I have looked forward with enjoyment to things. 0    I have blamed myself unnecessarily when things went wrong. 1    I have been anxious or worried for no good reason. 1    I have felt scared or panicky for no good reason. 1    Things have been getting on top of me. 0    I have been so unhappy that I have had difficulty sleeping. 0    I have felt sad or miserable. 1    I have been so unhappy that I have been crying. 0    The thought of harming myself has occurred to me. 0    Edinburgh Postnatal Depression Scale Total 4            The following portions of the patient's history were reviewed and updated as appropriate: allergies, current medications, past family history, past medical history, past social history, past surgical history and problem list.  Review of Systems Pertinent items are noted in HPI.    Objective:  BP 108/72   Pulse 69   Ht 5' (1.524 m)   Wt 132 lb (59.9 kg)   LMP 07/01/2019   Breastfeeding Yes   BMI 25.78 kg/m     General:  alert, cooperative and no distress   Breasts:  deferred  Lungs: clear to auscultation bilaterally  Heart:  regular rate and rhythm  Abdomen: soft, non-tender; bowel sounds normal; no masses,  no organomegaly   Vulva:  normal  Vagina: normal vagina  Cervix:  normal appearance, paraguard IUD strings easily seen  Corpus: not examined  Adnexa:  not evaluated  Rectal Exam: Not performed.        Assessment:     postpartum exam. Pap smear not done at today's visit.   Plan:   Essential components of care per ACOG recommendations:  1.  Mood and well being: Patient with negative depression screening today. Reviewed local resources for support.  - Patient does not use tobacco.  - hx of drug use? No   2. Infant care and feeding:  -Patient currently breastmilk feeding? Yes If breastmilk feeding discussed return to work and pumping. If needed, patient was provided letter for work to allow for every 2-3 hr pumping breaks, and to be granted a private location to express breastmilk and refrigerated area to store breastmilk. Reviewed importance of draining breast regularly to support lactation. -Social determinants of health (SDOH) reviewed in EPIC. No concerns  3. Sexuality, contraception and birth spacing - Patient does not want  a pregnancy in the next year.  Desired family size is 4 children.  - Reviewed forms of contraception in tiered fashion. Patient desired IUD today.   - Discussed birth spacing of 18 months  4. Sleep and fatigue -Encouraged family/partner/community support of 4 hrs of uninterrupted sleep to help with mood and fatigue  5. Physical Recovery  - Discussed patients delivery and complications C/S incision healing well.  Small disruption on each corner< 37mm, advised neosporin every other day - Patient has urinary incontinence? No - Patient is safe to resume physical and sexual activity  6.  Health Maintenance - Last pap smear done 12/22/2019 and was normal with  negative HPV.   Pt advised to resume annual exam 12/2020 either with our group or [facility].   Warden Fillers, MD [facility] [employer]

## 2021-09-17 IMAGING — US US MFM OB DETAIL+14 WK
1 series · 15 of 28 positions shown · non-contrast
Comparison: none

[Series 1: us mfm ob detail+14 wk · 155 acquisitions, 15 frames shown]
[im 1/155]
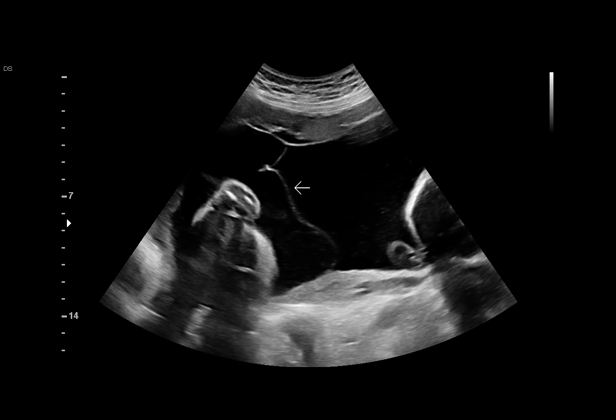
[im 12/155]
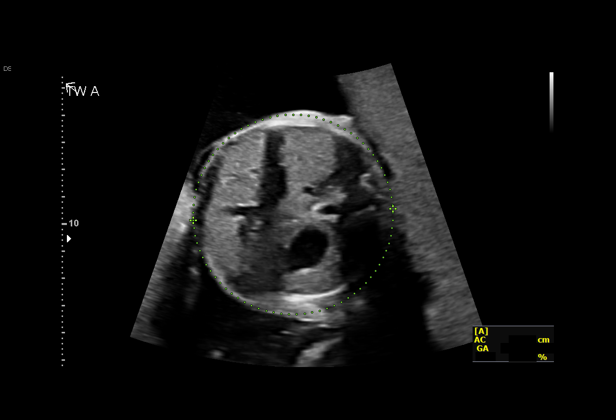
[im 23/155]
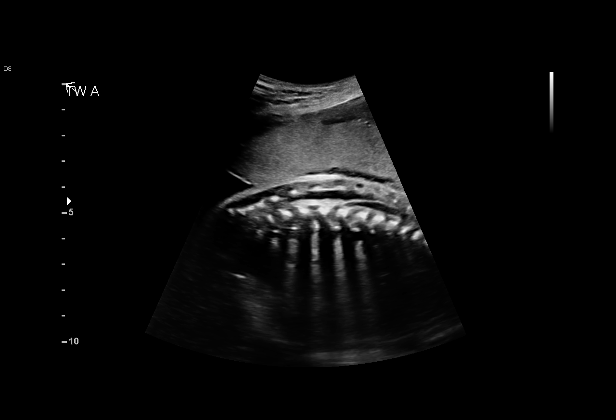
[im 35/155]
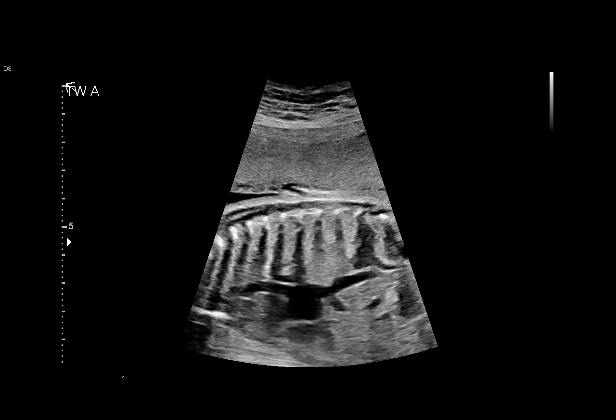
[im 46/155]
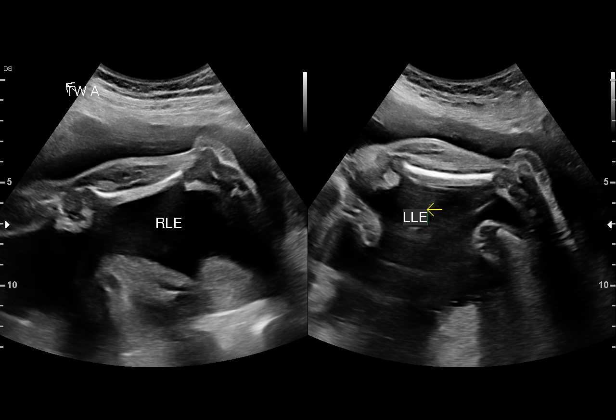
[im 58/155]
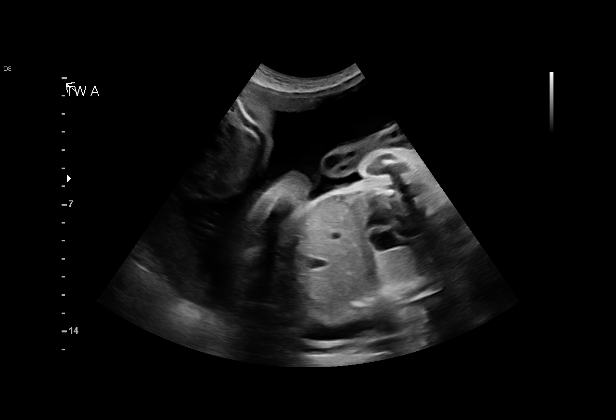
[im 69/155]
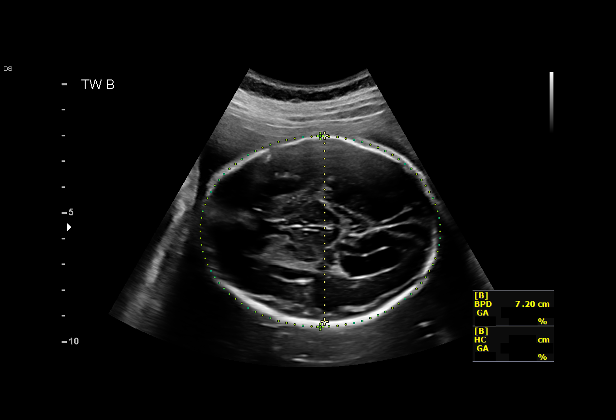
[im 80/155]
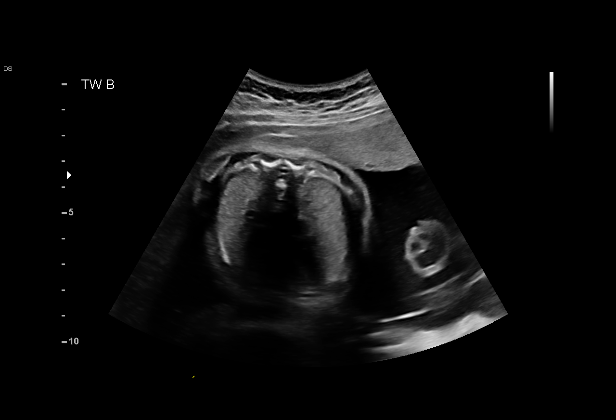
[im 86/155]
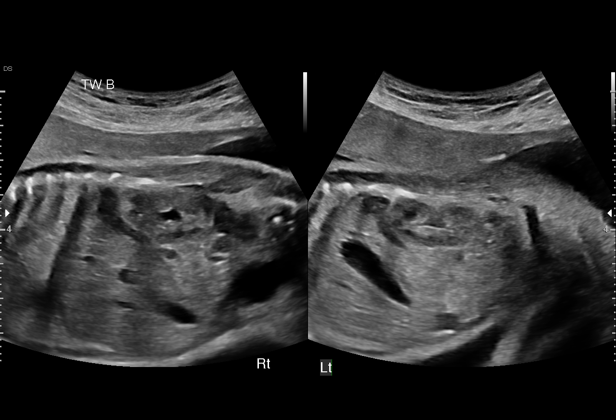
[im 97/155]
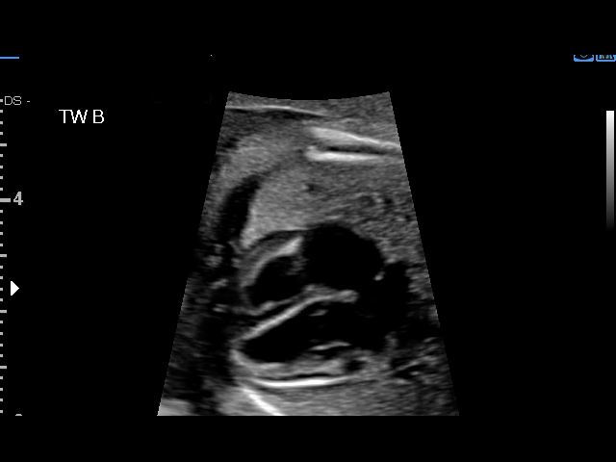
[im 109/155]
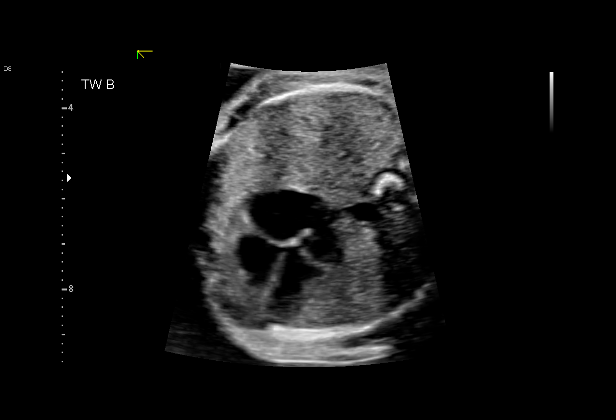
[im 120/155]
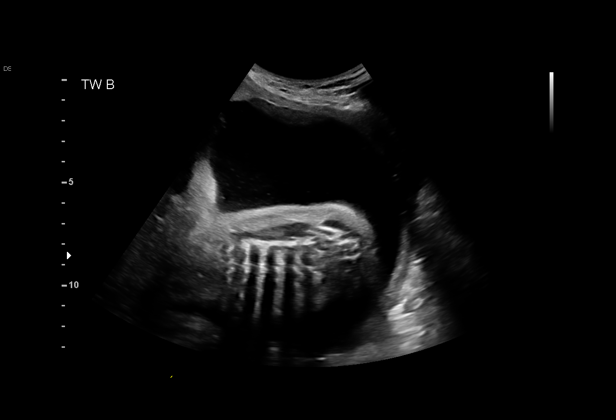
[im 132/155]
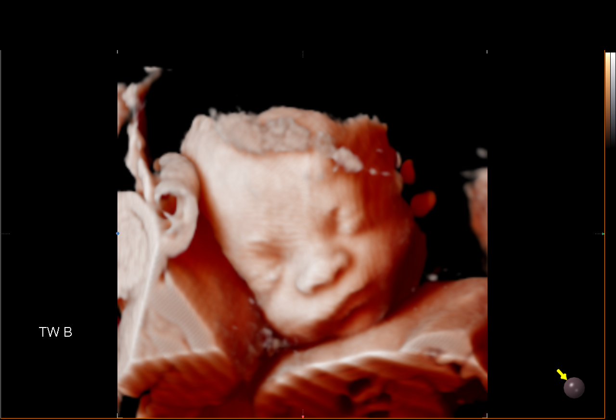
[im 143/155]
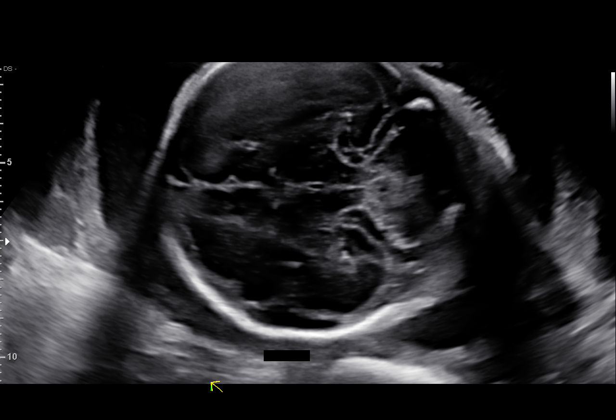
[im 155/155]
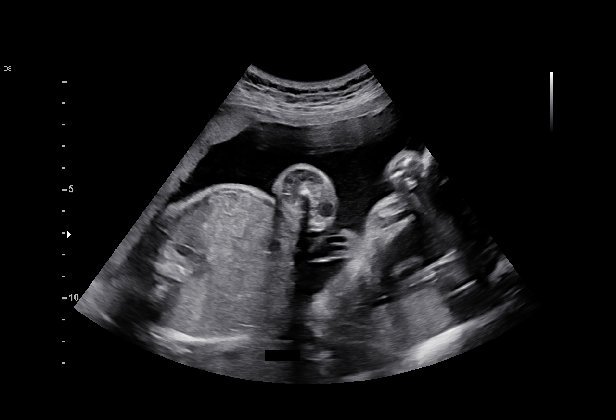

[15 of 28 positions shown; findings below may reference images not displayed]

[REDACTED]

 2  US MFM OB DETAIL ADDL GEST            76811.02    ERXLEBEN
    +14 WK

Indications

 Twin pregnancy, di/di, second trimester
 Late prenatal care, second trimester
 27 weeks gestation of pregnancy
 Encounter for fetal growth retardation
Fetal Evaluation (Fetus A)

 Num Of Fetuses:         2
 Cardiac Activity:       Observed
 Fetal Lie:              Lower Fetus
 Presentation:           Cephalic
 Placenta:               Anterior Fundal fused
 P. Cord Insertion:      Visualized, central
 Membrane Desc:      Dividing Membrane seen - Dichorionic.
 Membrane Size:

 Amniotic Fluid
 AFI FV:      Within normal limits

                             Largest Pocket(cm)

Biometry (Fetus A)
 BPD:      75.4  mm     G. Age:  30w 2d         98  %    CI:        76.21   %    70 - 86
                                                         FL/HC:      17.9   %    18.6 -
 HC:      273.7  mm     G. Age:  29w 6d         91  %    HC/AC:      1.19        1.05 -
 AC:      229.1  mm     G. Age:  27w 2d         37  %    FL/BPD:     64.9   %    71 - 87
 FL:       48.9  mm     G. Age:  26w 3d         12  %    FL/AC:      21.3   %    20 - 24
 HUM:        47  mm     G. Age:  27w 5d         52  %
 CER:      31.4  mm     G. Age:  27w 1d         52  %

 LV:        6.8  mm

 Est. FW:    1471  gm      2 lb 6 oz     37  %     FW Discordancy      0 \ 5 %
OB History

 Gravidity:    3         Term:   2
 Living:       2
Gestational Age (Fetus A)

 LMP:           30w 3d        Date:  07/01/19                 EDD:   04/06/20
 U/S Today:     28w 3d                                        EDD:   04/20/20
 Best:          27w 3d     Det. By:  Previous Ultrasound      EDD:   04/27/20
                                     (01/05/20)
Anatomy (Fetus A)

 Cranium:               Appears normal         LVOT:                   Appears normal
 Cavum:                 Appears normal         Aortic Arch:            Appears normal
 Ventricles:            Appears normal         Ductal Arch:            Appears normal
 Choroid Plexus:        Appears normal         Diaphragm:              Appears normal
 Cerebellum:            Appears normal         Stomach:                Appears normal, left
                                                                       sided
 Posterior Fossa:       Appears normal         Abdomen:                Appears normal
 Nuchal Fold:           Not applicable (>20    Abdominal Wall:         Appears nml (cord
                        wks GA)                                        insert, abd wall)
 Face:                  Appears normal         Cord Vessels:           Appears normal (3
                        (orbits and profile)                           vessel cord)
 Lips:                  Appears normal         Kidneys:                Appear normal
 Palate:                Appears normal         Bladder:                Appears normal
 Thoracic:              Appears normal         Spine:                  Appears normal
 Heart:                 Appears normal         Upper Extremities:      Appears normal
                        (4CH, axis, and
                        situs)
 RVOT:                  Appears normal         Lower Extremities:      Appears normal

 Other:  Fetus appears to be a male.Nasal bone visualized. Heels/feet and
         open hands/5th digits visualized. 3VV and 3VTV visualized. IVC /
         SVC seen.

Fetal Evaluation (Fetus B)

 Num Of Fetuses:         2
 Cardiac Activity:       Observed
 Fetal Lie:              Upper Fetus
 Presentation:           Transverse, head to maternal right
 Placenta:               Anterior Fundal fused
 P. Cord Insertion:      Visualized, central
 Membrane Desc:      Dividing Membrane seen - Monochorionic
 Amniotic Fluid
 AFI FV:      Within normal limits

                             Largest Pocket(cm)

Biometry (Fetus B)

 BPD:      71.8  mm     G. Age:  28w 6d         82  %    CI:        74.57   %    70 - 86
                                                         FL/HC:      18.6   %    18.6 -
 HC:      263.9  mm     G. Age:  28w 5d         63  %    HC/AC:      1.18        1.05 -
 AC:      224.1  mm     G. Age:  26w 6d         24  %    FL/BPD:     68.5   %    71 - 87
 FL:       49.2  mm     G. Age:  26w 4d         14  %    FL/AC:      22.0   %    20 - 24
 HUM:      44.1  mm     G. Age:  26w 1d         19  %
 CER:      32.3  mm     G. Age:  27w 5d         67  %
 LV:          8  mm

 Est. FW:    7170  gm      2 lb 4 oz     23  %     FW Discordancy         5  %
Gestational Age (Fetus B)

 LMP:           30w 3d        Date:  07/01/19                 EDD:   04/06/20
 U/S Today:     27w 5d                                        EDD:   04/25/20
 Best:          27w 3d     Det. By:  Previous Ultrasound      EDD:   04/27/20
                                     (01/05/20)
Anatomy (Fetus B)

 Cranium:               Appears normal         LVOT:                   Appears normal
 Cavum:                 Appears normal         Aortic Arch:            Appears normal
 Ventricles:            Appears normal         Ductal Arch:            Appears normal
 Choroid Plexus:        Appears normal         Diaphragm:              Appears normal
 Cerebellum:            Appears normal         Stomach:                Appears normal, left
                                                                       sided
 Posterior Fossa:       Appears normal         Abdomen:                Appears normal
 Nuchal Fold:           Not applicable (>20    Abdominal Wall:         Appears nml (cord
                        wks GA)                                        insert, abd wall)
 Face:                  Appears normal         Cord Vessels:           Appears normal (3
                        (orbits and profile)                           vessel cord)
 Lips:                  Appears normal         Kidneys:                Appear normal
 Palate:                Appears normal         Bladder:                Appears normal
 Thoracic:              Appears normal         Spine:                  Appears normal
 Heart:                 Appears normal         Upper Extremities:      Appears normal
                        (4CH, axis, and
                        situs)
 RVOT:                  Appears normal         Lower Extremities:      Appears normal

 Other:  Fetus appears to be female. Nasal bone visualized. Heels appear
         normal. Hands not well visualized. 3VV and 3VTV visualized.  IVC /
         SVC seen.
Cervix Uterus Adnexa

 Cervix
 Length:            3.6  cm.
 Normal appearance by transabdominal scan.
Comments

 This patient was seen due to a spontaneously conceived twin
 pregnancy.  She denies any significant past medical history
 and denies any problems in her current pregnancy.   The
 patient presented late for prenatal care.
 screening tests for fetal aneuploidy drawn in her current
 pregnancy.
 A thick dividing membrane was noted separating the two
 fetuses, indicating that these are dichorionic, diamniotic
 twins.  A male and female fetuses are noted today.
 The fetal growth and amniotic fluid level appeared
 appropriate for both twin A and twin B.
 There were no obvious anomalies noted in either twin A or
 twin B today.  However, the views of the fetal anatomy were
 limited today due to the fetal positions and her advanced
 gestational age.
 The limitations of ultrasound in the detection of all anomalies
 was discussed today.
 The management of dichorionic twins was discussed.  She
 was advised that management of twin pregnancies will
 involve frequent ultrasound exams to assess the fetal growth
 and amniotic fluid level. We will continue to follow her with
 serial growth ultrasounds.  Weekly fetal testing for dichorionic
 twins should start at around 36 weeks.  Delivery for
 uncomplicated dichorionic twins should occur at around 38
 weeks.
 The increased risk of preeclampsia, gestational diabetes, and
 preterm birth/labor associated with twin pregnancies was
 discussed.  She was advised that she will continue to be
 followed closely to assess for these conditions. As
 pregnancies with multiple gestations are at increased risk for
 developing preeclampsia, she was advised to start taking a
 daily baby aspirin (81 mg per day) to decrease her risk of
 developing preeclampsia  as soon as possible.
 A follow-up exam was scheduled in 4 weeks to assess the
 fetal growth and to complete the views of the fetal anatomy.

## 2021-10-16 IMAGING — US US MFM OB FOLLOW-UP
1 series · 16 of 28 positions shown · non-contrast
Comparison: none

[Series 1: us mfm ob follow-up · 91 acquisitions, 16 frames shown]
[im 1/91]
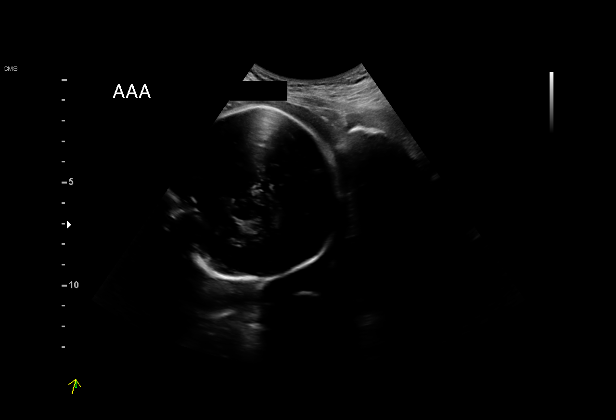
[im 7/91]
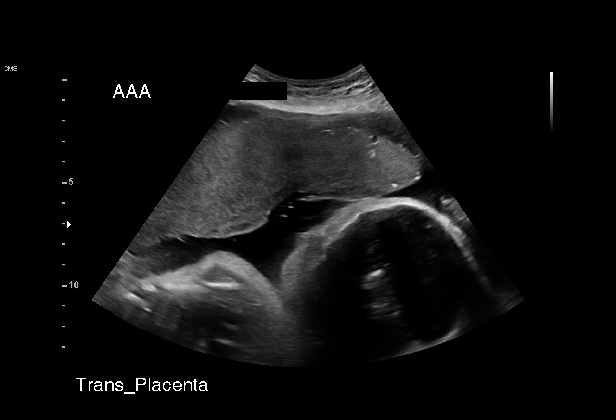
[im 14/91]
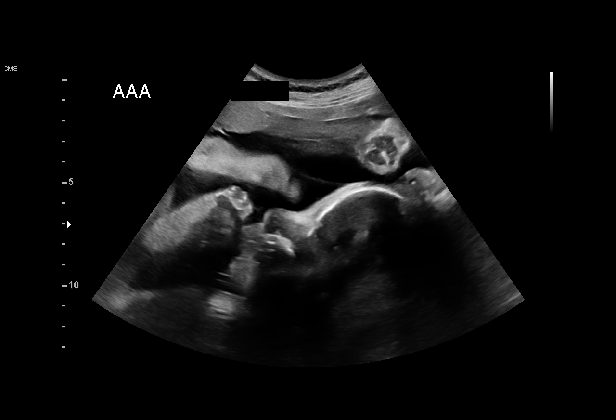
[im 21/91]
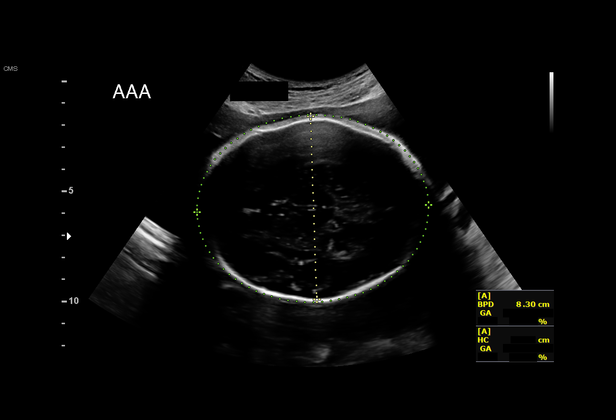
[im 24/91]
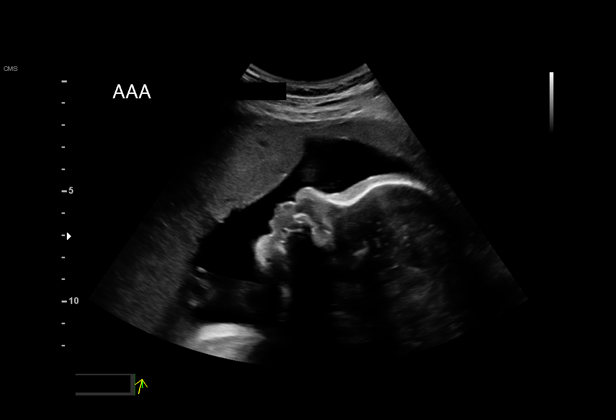
[im 31/91]
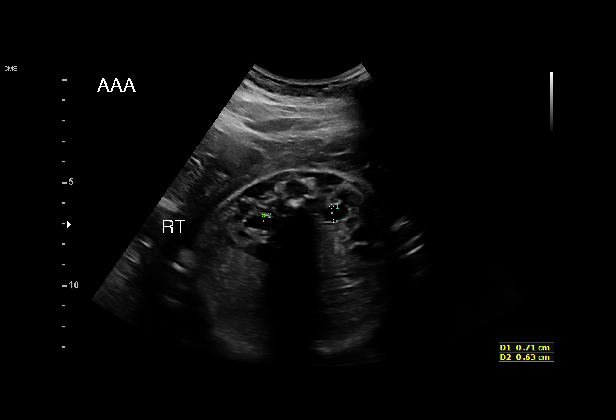
[im 37/91]
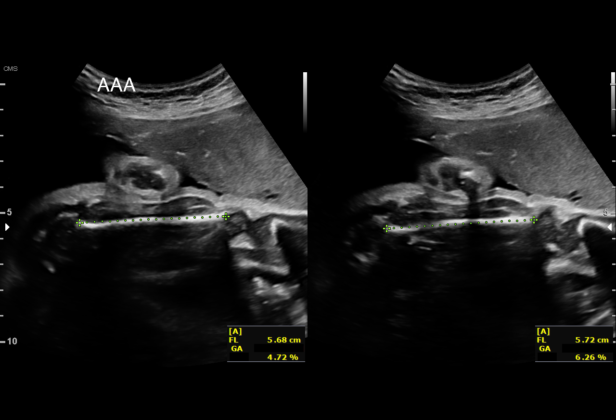
[im 44/91]
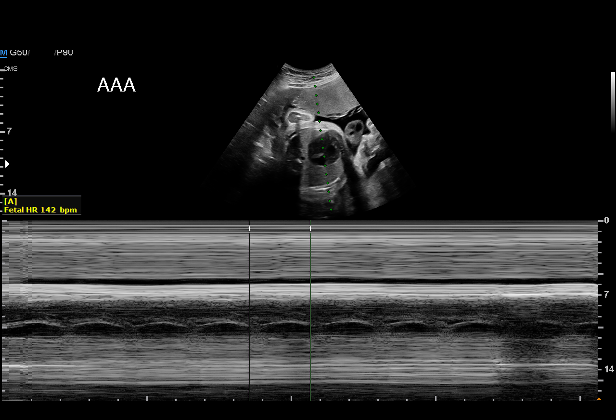
[im 47/91]
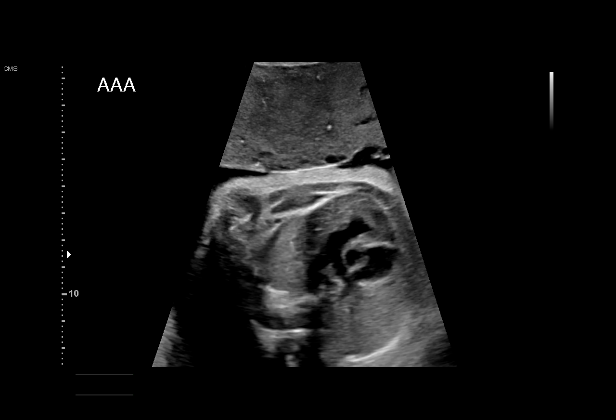
[im 54/91]
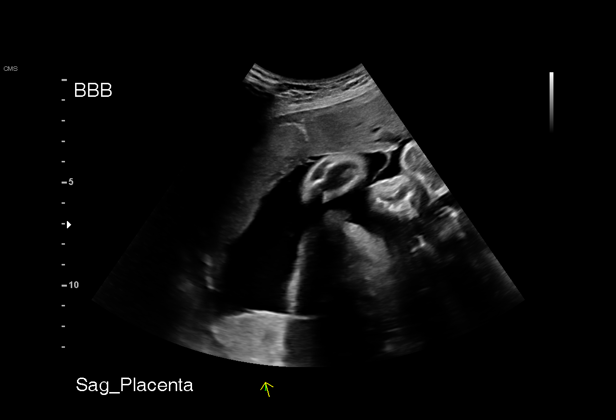
[im 61/91]
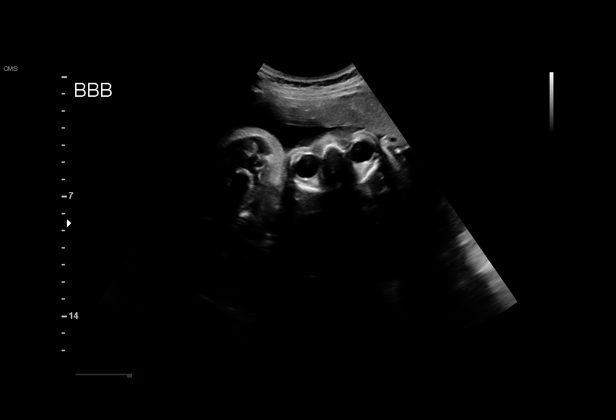
[im 67/91]
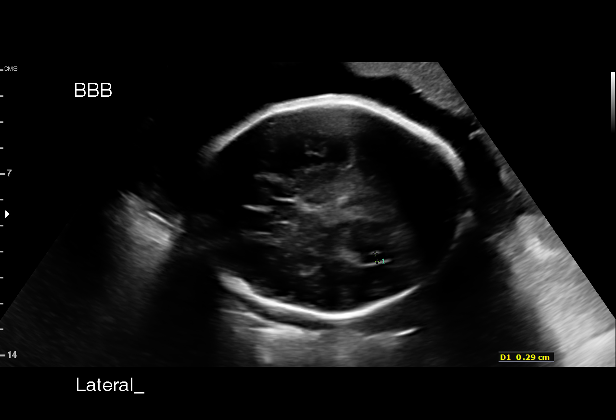
[im 71/91]
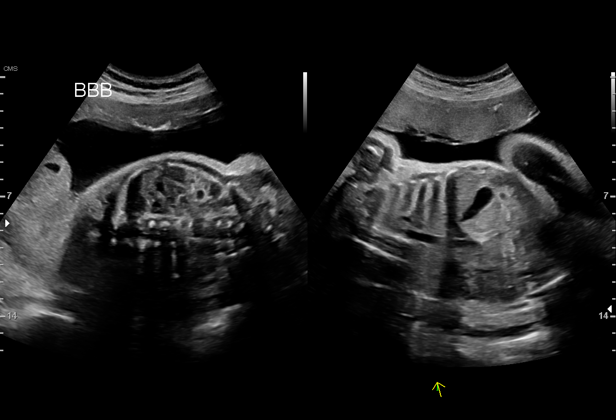
[im 77/91]
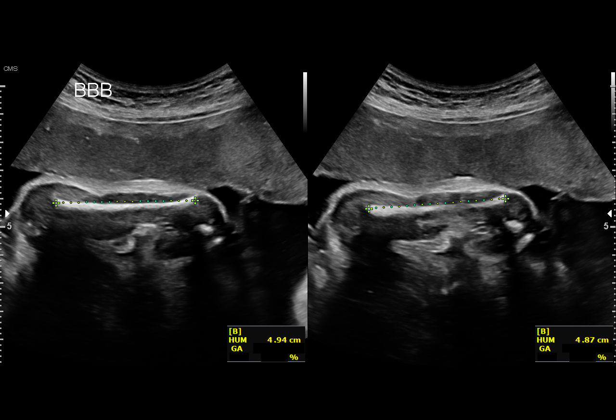
[im 84/91]
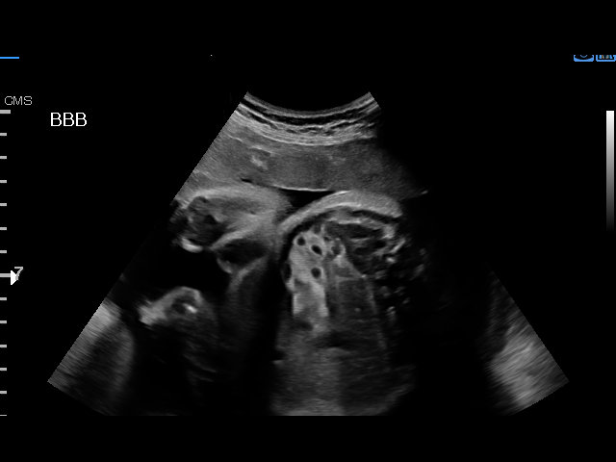
[im 91/91]
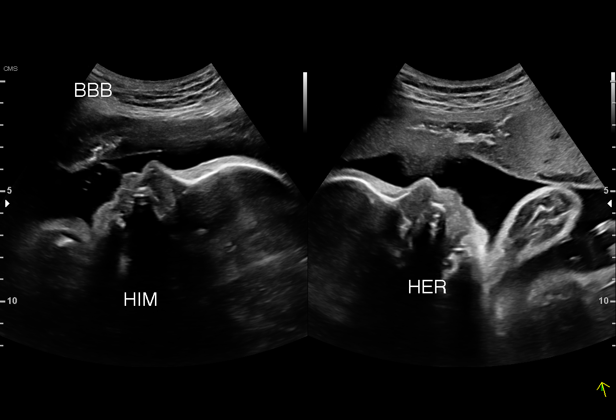

[16 of 28 positions shown; findings below may reference images not displayed]

[REDACTED]

    GEST

Indications

 Encounter for other antenatal screening
 follow-up
 Twin pregnancy, di/di, third trimester
 Late to prenatal care, third trimester
 31 weeks gestation of pregnancy
Fetal Evaluation (Fetus A)

 Num Of Fetuses:         2
 Fetal Heart Rate(bpm):  142
 Cardiac Activity:       Observed
 Fetal Lie:              Lower Fetus
 Presentation:           Variable
 Placenta:               Anterior Fundal
 P. Cord Insertion:      Visualized
 Membrane Desc:      Dividing Membrane seen - Dichorionic.

 Amniotic Fluid
 AFI FV:      Within normal limits

                             Largest Pocket(cm)

Biometry (Fetus A)

 BPD:      82.9  mm     G. Age:  33w 2d         87  %    CI:        74.69   %    70 - 86
                                                         FL/HC:      18.8   %    19.1 -
 HC:      304.4  mm     G. Age:  33w 6d         75  %    HC/AC:      1.06        0.96 -
 AC:       286   mm     G. Age:  32w 4d         78  %    FL/BPD:     68.9   %    71 - 87
 FL:       57.1  mm     G. Age:  30w 0d          6  %    FL/AC:      20.0   %    20 - 24
 HUM:      51.8  mm     G. Age:  30w 2d         24  %

 Est. FW:    9113  gm      4 lb 2 oz     53  %     FW Discordancy      0 \ 0 %
OB History

 Gravidity:    3         Term:   2
 Living:       2
Gestational Age (Fetus A)

 LMP:           34w 4d        Date:  07/01/19                 EDD:   04/06/20
 U/S Today:     32w 3d                                        EDD:   04/21/20
 Best:          31w 4d     Det. By:  Previous Ultrasound      EDD:   04/27/20
                                     (01/05/20)
Anatomy (Fetus A)

 Cranium:               Appears normal         LVOT:                   Appears normal
 Cavum:                 Appears normal         Aortic Arch:            Previously seen
 Ventricles:            Appears normal         Ductal Arch:            Previously seen
 Choroid Plexus:        Previously seen        Diaphragm:              Appears normal
 Cerebellum:            Appears normal         Stomach:                Appears normal, left
                                                                       sided
 Posterior Fossa:       Previously seen        Abdomen:                Appears normal
 Nuchal Fold:           Not applicable (>20    Abdominal Wall:         Previously seen
                        wks GA)
 Face:                  Appears normal         Cord Vessels:           Previously seen
                        (orbits and profile)
 Lips:                  Appears normal         Bladder:                Appears normal
 Palate:                Previously seen        Spine:                  Previously seen
 Thoracic:              Appears normal         Upper Extremities:      Previously seen
 Heart:                 Appears normal         Lower Extremities:      Previously seen
                        (4CH, axis, and
                        situs)
 RVOT:                  Appears normal

 Other:  Male gender previously seen.Nasal bone visualized. Heels/feet and
         open hands/5th digits previously visualized. 3VV and 3VTV previously
         visualized. IVC / SVC seen previously

Fetal Evaluation (Fetus B)

 Num Of Fetuses:         2
 Fetal Heart Rate(bpm):  150
 Cardiac Activity:       Observed
 Fetal Lie:              Upper Fetus
 Presentation:           Transverse, head to maternal right
 Placenta:               Anterior
 P. Cord Insertion:      Visualized
 Membrane Desc:      Dividing Membrane seen - Dichorionic.
 Amniotic Fluid
 AFI FV:      Within normal limits

                             Largest Pocket(cm)
                             7
Biometry (Fetus B)

 BPD:      80.8  mm     G. Age:  32w 3d         67  %    CI:        70.15   %    70 - 86
                                                         FL/HC:      18.7   %    19.1 -
 HC:      307.7  mm     G. Age:  34w 2d         85  %    HC/AC:      1.08        0.96 -
 AC:      285.3  mm     G. Age:  32w 4d         76  %    FL/BPD:     71.0   %    71 - 87
 FL:       57.4  mm     G. Age:  30w 1d          7  %    FL/AC:      20.1   %    20 - 24
 HUM:      49.4  mm     G. Age:  29w 0d        < 5  %

 Est. FW:    1222  gm      4 lb 2 oz     52  %     FW Discordancy         0  %
Gestational Age (Fetus B)

 LMP:           34w 4d        Date:  07/01/19                 EDD:   04/06/20
 U/S Today:     32w 3d                                        EDD:   04/21/20
 Best:          31w 4d     Det. By:  Previous Ultrasound      EDD:   04/27/20
                                     (01/05/20)
Anatomy (Fetus B)

 Cranium:               Appears normal         LVOT:                   Appears normal
 Cavum:                 Appears normal         Aortic Arch:            Previously seen
 Ventricles:            Appears normal         Ductal Arch:            Previously seen
 Choroid Plexus:        Previously seen        Diaphragm:              Appears normal
 Cerebellum:            Previously seen        Stomach:                Appears normal, left
                                                                       sided
 Posterior Fossa:       Previously seen        Abdomen:                Appears normal
 Nuchal Fold:           Not applicable (>20    Abdominal Wall:         Previously seen
                        wks GA)
 Face:                  Appears normal         Cord Vessels:           Previously seen
                        (orbits and profile)
 Lips:                  Appears normal         Kidneys:                Appear normal
 Palate:                Previously seen        Bladder:                Appears normal
 Thoracic:              Appears normal         Spine:                  Previously seen
 Heart:                 Previously seen        Upper Extremities:      Previously seen
 RVOT:                  Previously seen        Lower Extremities:      Previously seen

 Other:  Female gender previously seen.Nasal bone visualized. Heels
         previously visualizedl. Hands not well visualized. 3VV and 3VTV
         previously visualized.  IVC / SVC seen previously.
Cervix Uterus Adnexa

 Cervix
 Not visualized (advanced GA >21wks)

 Uterus
 No abnormality visualized.

 Right Ovary
 Not visualized.
 Left Ovary
 Not visualized.

 Cul De Sac
 No free fluid seen.

 Adnexa
 No abnormality visualized.
Impression

 Twin intrauterine pregnancy with features suggestive of
 Diamniotic Dichorionic pregnancy.
 Normal follow up anatomy with good amniotic fluid and fetal
 movement was observed in Twin A and B. There is no
 intertwin discordance.

 I reviewed the normal nature of today's ultrasound. Ms.
 DELMARKO conveyed that she is taking low dose aspirin for
 preeclampsia prevention.
 In addition,  I discussed with Ms. Dastagir finding of
 mild right unilateral renal pyelectasis with a measurement of
 7.1 mm. I discussed the etiology of renal pyelectasis to
 include normal variant, ureteropelvic or vesicle junction
 obstruction and urethrovesicle reflux. Prior to 32 weeks the
 threshold for abnormal is <4mm but after 32 weeks >7 mm.
 The renal pelvis measures 7.1 mm today at [REDACTED] grade 1
 appearnace without renal caliectasis.
Recommendations

 Follow up growth in 4 weeks
 Consider delivery between 37-38 weeks.
 If renal pyelecatsis persist we recommend postnatal
 ultrasound.

## 2023-07-09 ENCOUNTER — Ambulatory Visit (INDEPENDENT_AMBULATORY_CARE_PROVIDER_SITE_OTHER): Payer: Self-pay | Admitting: Primary Care
